# Patient Record
Sex: Male | Born: 1965 | Race: White | Hispanic: No | Marital: Married | Smoking: Never smoker
Health system: Southern US, Community
[De-identification: ages and names within clinical notes are randomized; demographics above are authoritative.]

## PROBLEM LIST (undated history)

## (undated) DIAGNOSIS — R011 Cardiac murmur, unspecified: Secondary | ICD-10-CM

## (undated) DIAGNOSIS — C801 Malignant (primary) neoplasm, unspecified: Secondary | ICD-10-CM

## (undated) DIAGNOSIS — J45909 Unspecified asthma, uncomplicated: Secondary | ICD-10-CM

## (undated) DIAGNOSIS — F419 Anxiety disorder, unspecified: Secondary | ICD-10-CM

## (undated) HISTORY — PX: VASECTOMY: SHX75

## (undated) HISTORY — PX: WISDOM TOOTH EXTRACTION: SHX21

---

## 2017-03-05 DIAGNOSIS — M545 Low back pain: Secondary | ICD-10-CM | POA: Diagnosis not present

## 2017-07-13 DIAGNOSIS — M5032 Other cervical disc degeneration, mid-cervical region, unspecified level: Secondary | ICD-10-CM | POA: Diagnosis not present

## 2017-07-13 DIAGNOSIS — M9901 Segmental and somatic dysfunction of cervical region: Secondary | ICD-10-CM | POA: Diagnosis not present

## 2017-07-13 DIAGNOSIS — S161XXA Strain of muscle, fascia and tendon at neck level, initial encounter: Secondary | ICD-10-CM | POA: Diagnosis not present

## 2017-07-16 DIAGNOSIS — Z125 Encounter for screening for malignant neoplasm of prostate: Secondary | ICD-10-CM | POA: Diagnosis not present

## 2017-07-16 DIAGNOSIS — E349 Endocrine disorder, unspecified: Secondary | ICD-10-CM | POA: Diagnosis not present

## 2017-07-16 DIAGNOSIS — E559 Vitamin D deficiency, unspecified: Secondary | ICD-10-CM | POA: Diagnosis not present

## 2017-07-16 DIAGNOSIS — Z0000 Encounter for general adult medical examination without abnormal findings: Secondary | ICD-10-CM | POA: Diagnosis not present

## 2017-07-18 DIAGNOSIS — M5032 Other cervical disc degeneration, mid-cervical region, unspecified level: Secondary | ICD-10-CM | POA: Diagnosis not present

## 2017-07-18 DIAGNOSIS — S161XXA Strain of muscle, fascia and tendon at neck level, initial encounter: Secondary | ICD-10-CM | POA: Diagnosis not present

## 2017-07-18 DIAGNOSIS — M9901 Segmental and somatic dysfunction of cervical region: Secondary | ICD-10-CM | POA: Diagnosis not present

## 2017-07-23 DIAGNOSIS — S161XXA Strain of muscle, fascia and tendon at neck level, initial encounter: Secondary | ICD-10-CM | POA: Diagnosis not present

## 2017-07-23 DIAGNOSIS — M9901 Segmental and somatic dysfunction of cervical region: Secondary | ICD-10-CM | POA: Diagnosis not present

## 2017-07-23 DIAGNOSIS — M5032 Other cervical disc degeneration, mid-cervical region, unspecified level: Secondary | ICD-10-CM | POA: Diagnosis not present

## 2017-07-25 DIAGNOSIS — M5032 Other cervical disc degeneration, mid-cervical region, unspecified level: Secondary | ICD-10-CM | POA: Diagnosis not present

## 2017-07-25 DIAGNOSIS — S161XXA Strain of muscle, fascia and tendon at neck level, initial encounter: Secondary | ICD-10-CM | POA: Diagnosis not present

## 2017-07-25 DIAGNOSIS — M9901 Segmental and somatic dysfunction of cervical region: Secondary | ICD-10-CM | POA: Diagnosis not present

## 2017-07-30 DIAGNOSIS — S161XXA Strain of muscle, fascia and tendon at neck level, initial encounter: Secondary | ICD-10-CM | POA: Diagnosis not present

## 2017-07-30 DIAGNOSIS — M5032 Other cervical disc degeneration, mid-cervical region, unspecified level: Secondary | ICD-10-CM | POA: Diagnosis not present

## 2017-07-30 DIAGNOSIS — M9901 Segmental and somatic dysfunction of cervical region: Secondary | ICD-10-CM | POA: Diagnosis not present

## 2017-08-01 DIAGNOSIS — M9901 Segmental and somatic dysfunction of cervical region: Secondary | ICD-10-CM | POA: Diagnosis not present

## 2017-08-01 DIAGNOSIS — S161XXA Strain of muscle, fascia and tendon at neck level, initial encounter: Secondary | ICD-10-CM | POA: Diagnosis not present

## 2017-08-01 DIAGNOSIS — M5032 Other cervical disc degeneration, mid-cervical region, unspecified level: Secondary | ICD-10-CM | POA: Diagnosis not present

## 2017-08-02 DIAGNOSIS — M5032 Other cervical disc degeneration, mid-cervical region, unspecified level: Secondary | ICD-10-CM | POA: Diagnosis not present

## 2017-08-02 DIAGNOSIS — S161XXA Strain of muscle, fascia and tendon at neck level, initial encounter: Secondary | ICD-10-CM | POA: Diagnosis not present

## 2017-08-02 DIAGNOSIS — M9901 Segmental and somatic dysfunction of cervical region: Secondary | ICD-10-CM | POA: Diagnosis not present

## 2017-08-06 DIAGNOSIS — M5032 Other cervical disc degeneration, mid-cervical region, unspecified level: Secondary | ICD-10-CM | POA: Diagnosis not present

## 2017-08-06 DIAGNOSIS — M9901 Segmental and somatic dysfunction of cervical region: Secondary | ICD-10-CM | POA: Diagnosis not present

## 2017-08-06 DIAGNOSIS — S161XXA Strain of muscle, fascia and tendon at neck level, initial encounter: Secondary | ICD-10-CM | POA: Diagnosis not present

## 2017-08-09 DIAGNOSIS — M5032 Other cervical disc degeneration, mid-cervical region, unspecified level: Secondary | ICD-10-CM | POA: Diagnosis not present

## 2017-08-09 DIAGNOSIS — S161XXA Strain of muscle, fascia and tendon at neck level, initial encounter: Secondary | ICD-10-CM | POA: Diagnosis not present

## 2017-08-09 DIAGNOSIS — M9901 Segmental and somatic dysfunction of cervical region: Secondary | ICD-10-CM | POA: Diagnosis not present

## 2017-08-15 DIAGNOSIS — M9901 Segmental and somatic dysfunction of cervical region: Secondary | ICD-10-CM | POA: Diagnosis not present

## 2017-08-15 DIAGNOSIS — S161XXA Strain of muscle, fascia and tendon at neck level, initial encounter: Secondary | ICD-10-CM | POA: Diagnosis not present

## 2017-08-15 DIAGNOSIS — M5032 Other cervical disc degeneration, mid-cervical region, unspecified level: Secondary | ICD-10-CM | POA: Diagnosis not present

## 2017-08-22 DIAGNOSIS — M5032 Other cervical disc degeneration, mid-cervical region, unspecified level: Secondary | ICD-10-CM | POA: Diagnosis not present

## 2017-08-22 DIAGNOSIS — M9901 Segmental and somatic dysfunction of cervical region: Secondary | ICD-10-CM | POA: Diagnosis not present

## 2017-08-22 DIAGNOSIS — S161XXA Strain of muscle, fascia and tendon at neck level, initial encounter: Secondary | ICD-10-CM | POA: Diagnosis not present

## 2017-08-23 DIAGNOSIS — S161XXA Strain of muscle, fascia and tendon at neck level, initial encounter: Secondary | ICD-10-CM | POA: Diagnosis not present

## 2017-08-23 DIAGNOSIS — M9901 Segmental and somatic dysfunction of cervical region: Secondary | ICD-10-CM | POA: Diagnosis not present

## 2017-08-23 DIAGNOSIS — M5032 Other cervical disc degeneration, mid-cervical region, unspecified level: Secondary | ICD-10-CM | POA: Diagnosis not present

## 2017-08-31 DIAGNOSIS — K635 Polyp of colon: Secondary | ICD-10-CM | POA: Diagnosis not present

## 2017-08-31 DIAGNOSIS — D120 Benign neoplasm of cecum: Secondary | ICD-10-CM | POA: Diagnosis not present

## 2017-08-31 DIAGNOSIS — C187 Malignant neoplasm of sigmoid colon: Secondary | ICD-10-CM | POA: Diagnosis not present

## 2017-08-31 DIAGNOSIS — Z1211 Encounter for screening for malignant neoplasm of colon: Secondary | ICD-10-CM | POA: Diagnosis not present

## 2017-08-31 DIAGNOSIS — D125 Benign neoplasm of sigmoid colon: Secondary | ICD-10-CM | POA: Diagnosis not present

## 2017-09-05 DIAGNOSIS — C187 Malignant neoplasm of sigmoid colon: Secondary | ICD-10-CM | POA: Diagnosis not present

## 2017-09-07 ENCOUNTER — Ambulatory Visit: Payer: Self-pay | Admitting: General Surgery

## 2017-09-07 DIAGNOSIS — C187 Malignant neoplasm of sigmoid colon: Secondary | ICD-10-CM | POA: Diagnosis not present

## 2017-09-07 NOTE — H&P (View-Only) (Signed)
History of Present Illness Noah Ruff MD; 6/0/1093 10:26 AM) The patient is a 52 year old male who presents with colorectal cancer. 52 year old male who presents to the office for evaluation of a newly diagnosed colon cancer. He underwent a colonoscopy at the end of December 2018. A polyp was found in the sigmoid colon approximately 30 cm from the anal verge. This was resected piecemeal. It was also tattooed. 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 The patient was asymptomatic and undergoing a screening colonoscopy. He reports regular bowel habits. He denies any medical or surgical history.   Past Surgical History Noah Clark, [place]; 09/07/2017 10:04 AM) Colon Polyp Removal - Colonoscopy Oral Surgery Vasectomy  Diagnostic Studies History Noah Clark, [place]; 09/07/2017 10:04 AM) Colonoscopy within last year  Allergies Noah Clark, [place]; 09/07/2017 10:05 AM) No Known Drug Allergies [09/07/2017]:  Medication History Noah Clark, CMA; 09/07/2017 10:05 AM) No Current Medications Medications Reconciled  Social History Noah Clark, CMA; 09/07/2017 10:04 AM) Alcohol use Moderate alcohol use. Caffeine use Carbonated beverages, Coffee, Tea. Illicit drug use Remotely quit drug use. Tobacco use Never smoker.  Family History Noah Clark, [place]; 09/07/2017 10:04 AM) Alcohol Abuse Brother. Arthritis Brother, Father. Cerebrovascular Accident Father. Colon Polyps Father. Depression Mother. Diabetes Mellitus Father. Hypertension Brother, Father, Mother. Melanoma Father.  Other Problems Noah Clark, CMA; 09/07/2017 10:04 AM) Anxiety Disorder Back Pain Colon Cancer     Review of Systems (Noah Clark CMA; 09/07/2017 10:04 AM) General Not Present- Appetite Loss, Chills, Fatigue, Fever, Night Sweats, Weight Gain and Weight Loss. Skin Present- Dryness. Not Present- Change in Wart/Mole, Hives, Jaundice, New Lesions, Non-Healing Wounds, Rash and  Ulcer. HEENT Present- Hearing Loss, Ringing in the Ears and Seasonal Allergies. Not Present- Earache, Hoarseness, Nose Bleed, Oral Ulcers, Sinus Pain, Sore Throat, Visual Disturbances, Wears glasses/contact lenses and Yellow Eyes. Respiratory Not Present- Bloody sputum, Chronic Cough, Difficulty Breathing, Snoring and Wheezing. Breast Not Present- Breast Mass, Breast Pain, Nipple Discharge and Skin Changes. Cardiovascular Not Present- Chest Pain, Difficulty Breathing Lying Down, Leg Cramps, Palpitations, Rapid Heart Rate, Shortness of Breath and Swelling of Extremities. Gastrointestinal Present- Bloating. Not Present- Abdominal Pain, Bloody Stool, Change in Bowel Habits, Chronic diarrhea, Constipation, Difficulty Swallowing, Excessive gas, Gets full quickly at meals, Hemorrhoids, Indigestion, Nausea, Rectal Pain and Vomiting. Male Genitourinary Not Present- Blood in Urine, Change in Urinary Stream, Frequency, Impotence, Nocturia, Painful Urination, Urgency and Urine Leakage. Musculoskeletal Present- Back Pain. Not Present- Joint Pain, Joint Stiffness, Muscle Pain, Muscle Weakness and Swelling of Extremities. Neurological Not Present- Decreased Memory, Fainting, Headaches, Numbness, Seizures, Tingling, Tremor, Trouble walking and Weakness. Psychiatric Present- Anxiety. Not Present- Bipolar, Change in Sleep Pattern, Depression, Fearful and Frequent crying. Endocrine Not Present- Cold Intolerance, Excessive Hunger, Hair Changes, Heat Intolerance, Hot flashes and New Diabetes. Hematology Not Present- Blood Thinners, Easy Bruising, Excessive bleeding, Gland problems, HIV and Persistent Infections.  Vitals (Noah Clark CMA; 09/07/2017 10:05 AM) 09/07/2017 10:04 AM Weight: 240.31 lb Height: 72in Body Surface Area: 2.3 m Body Mass Index: 32.59 kg/m  Temp.: 98.20F  Pulse: 65 (Regular)  P.OX: 97% (Room air) BP: 136/92 (Sitting, Left Arm, Standard)      Physical Exam Noah Ruff MD;  10/08/5571 10:48 AM)  General Mental Status-Alert. General Appearance-Not in acute distress. Build & Nutrition-Well nourished. Posture-Normal posture. Gait-Normal.  Head and Neck Head-normocephalic, atraumatic with no lesions or palpable masses. Trachea-midline.  Chest and Lung Exam Chest and lung exam reveals -on auscultation, normal breath sounds, no adventitious sounds and  normal vocal resonance.  Cardiovascular Cardiovascular examination reveals -normal heart sounds, regular rate and rhythm with no murmurs and no digital clubbing, cyanosis, edema, increased warmth or tenderness.  Abdomen Inspection Inspection of the abdomen reveals - No Hernias. Palpation/Percussion Palpation and Percussion of the abdomen reveal - Soft, Non Tender, No Rigidity (guarding), No hepatosplenomegaly and No Palpable abdominal masses.  Neurologic Neurologic evaluation reveals -alert and oriented x 3 with no impairment of recent or remote memory, normal attention span and ability to concentrate, normal sensation and normal coordination.  Musculoskeletal Normal Exam - Bilateral-Upper Extremity Strength Normal and Lower Extremity Strength Normal.    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 This was tattooed. Cancer was noted at the cauterized edges. We discussed the possibility that he could still have tumor within his colon. We discussed the procedure of a minimally invasive colectomy in detail. We will also get CT scans of his chest abdomen and pelvis and CEA level to complete his metastatic workup prior to surgery. The surgery and anatomy were described to the patient as well as the risks of surgery and the possible complications. These include: Bleeding, deep  abdominal infections and possible wound complications such as hernia and infection, damage to adjacent structures, leak of surgical connections, which can lead to other surgeries and possibly an ostomy, possible need for other procedures, such as abscess drains in radiology, possible prolonged hospital stay, possible diarrhea from removal of part of the colon, possible constipation from narcotics, possible bowel, bladder or sexual dysfunction if having rectal surgery, prolonged fatigue/weakness or appetite loss, possible early recurrence of of disease, possible complications of their medical problems such as heart disease or arrhythmias or lung problems, death (less than 1%). I believe the patient understands and wishes to proceed with the surgery.

## 2017-09-07 NOTE — H&P (Signed)
History of Present Illness Leighton Ruff MD; 02/09/1244 10:26 AM) The patient is a 52 year old male who presents with colorectal cancer. 52 year old male who presents to the office for evaluation of a newly diagnosed colon cancer. He underwent a colonoscopy at the end of December 2018. A polyp was found in the sigmoid colon approximately 30 cm from the anal verge. This was resected piecemeal. It was also tattooed. 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 The patient was asymptomatic and undergoing a screening colonoscopy. He reports regular bowel habits. He denies any medical or surgical history.   Past Surgical History Benjiman Core, [place]; 09/07/2017 10:04 AM) Colon Polyp Removal - Colonoscopy Oral Surgery Vasectomy  Diagnostic Studies History Benjiman Core, [place]; 09/07/2017 10:04 AM) Colonoscopy within last year  Allergies Benjiman Core, [place]; 09/07/2017 10:05 AM) No Known Drug Allergies [09/07/2017]:  Medication History Benjiman Core, CMA; 09/07/2017 10:05 AM) No Current Medications Medications Reconciled  Social History Benjiman Core, CMA; 09/07/2017 10:04 AM) Alcohol use Moderate alcohol use. Caffeine use Carbonated beverages, Coffee, Tea. Illicit drug use Remotely quit drug use. Tobacco use Never smoker.  Family History Benjiman Core, [place]; 09/07/2017 10:04 AM) Alcohol Abuse Brother. Arthritis Brother, Father. Cerebrovascular Accident Father. Colon Polyps Father. Depression Mother. Diabetes Mellitus Father. Hypertension Brother, Father, Mother. Melanoma Father.  Other Problems Benjiman Core, CMA; 09/07/2017 10:04 AM) Anxiety Disorder Back Pain Colon Cancer     Review of Systems ([name] CMA; 09/07/2017 10:04 AM) General Not Present- Appetite Loss, Chills, Fatigue, Fever, Night Sweats, Weight Gain and Weight Loss. Skin Present- Dryness. Not Present- Change in Wart/Mole, Hives, Jaundice, New Lesions, Non-Healing Wounds, Rash and  Ulcer. HEENT Present- Hearing Loss, Ringing in the Ears and Seasonal Allergies. Not Present- Earache, Hoarseness, Nose Bleed, Oral Ulcers, Sinus Pain, Sore Throat, Visual Disturbances, Wears glasses/contact lenses and Yellow Eyes. Respiratory Not Present- Bloody sputum, Chronic Cough, Difficulty Breathing, Snoring and Wheezing. Breast Not Present- Breast Mass, Breast Pain, Nipple Discharge and Skin Changes. Cardiovascular Not Present- Chest Pain, Difficulty Breathing Lying Down, Leg Cramps, Palpitations, Rapid Heart Rate, Shortness of Breath and Swelling of Extremities. Gastrointestinal Present- Bloating. Not Present- Abdominal Pain, Bloody Stool, Change in Bowel Habits, Chronic diarrhea, Constipation, Difficulty Swallowing, Excessive gas, Gets full quickly at meals, Hemorrhoids, Indigestion, Nausea, Rectal Pain and Vomiting. Male Genitourinary Not Present- Blood in Urine, Change in Urinary Stream, Frequency, Impotence, Nocturia, Painful Urination, Urgency and Urine Leakage. Musculoskeletal Present- Back Pain. Not Present- Joint Pain, Joint Stiffness, Muscle Pain, Muscle Weakness and Swelling of Extremities. Neurological Not Present- Decreased Memory, Fainting, Headaches, Numbness, Seizures, Tingling, Tremor, Trouble walking and Weakness. Psychiatric Present- Anxiety. Not Present- Bipolar, Change in Sleep Pattern, Depression, Fearful and Frequent crying. Endocrine Not Present- Cold Intolerance, Excessive Hunger, Hair Changes, Heat Intolerance, Hot flashes and New Diabetes. Hematology Not Present- Blood Thinners, Easy Bruising, Excessive bleeding, Gland problems, HIV and Persistent Infections.  Vitals ([name] CMA; 09/07/2017 10:05 AM) 09/07/2017 10:04 AM Weight: 240.31 lb Height: 72in Body Surface Area: 2.3 m Body Mass Index: 32.59 kg/m  Temp.: 98.43F  Pulse: 65 (Regular)  P.OX: 97% (Room air) BP: 136/92 (Sitting, Left Arm, Standard)      Physical Exam Leighton Ruff MD;  8/0/9983 10:48 AM)  General Mental Status-Alert. General Appearance-Not in acute distress. Build & Nutrition-Well nourished. Posture-Normal posture. Gait-Normal.  Head and Neck Head-normocephalic, atraumatic with no lesions or palpable masses. Trachea-midline.  Chest and Lung Exam Chest and lung exam reveals -on auscultation, normal breath sounds, no adventitious sounds and  normal vocal resonance.  Cardiovascular Cardiovascular examination reveals -normal heart sounds, regular rate and rhythm with no murmurs and no digital clubbing, cyanosis, edema, increased warmth or tenderness.  Abdomen Inspection Inspection of the abdomen reveals - No Hernias. Palpation/Percussion Palpation and Percussion of the abdomen reveal - Soft, Non Tender, No Rigidity (guarding), No hepatosplenomegaly and No Palpable abdominal masses.  Neurologic Neurologic evaluation reveals -alert and oriented x 3 with no impairment of recent or remote memory, normal attention span and ability to concentrate, normal sensation and normal coordination.  Musculoskeletal Normal Exam - Bilateral-Upper Extremity Strength Normal and Lower Extremity Strength Normal.    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 This was tattooed. Cancer was noted at the cauterized edges. We discussed the possibility that he could still have tumor within his colon. We discussed the procedure of a minimally invasive colectomy in detail. We will also get CT scans of his chest abdomen and pelvis and CEA level to complete his metastatic workup prior to surgery. The surgery and anatomy were described to the patient as well as the risks of surgery and the possible complications. These include: Bleeding, deep  abdominal infections and possible wound complications such as hernia and infection, damage to adjacent structures, leak of surgical connections, which can lead to other surgeries and possibly an ostomy, possible need for other procedures, such as abscess drains in radiology, possible prolonged hospital stay, possible diarrhea from removal of part of the colon, possible constipation from narcotics, possible bowel, bladder or sexual dysfunction if having rectal surgery, prolonged fatigue/weakness or appetite loss, possible early recurrence of of disease, possible complications of their medical problems such as heart disease or arrhythmias or lung problems, death (less than 1%). I believe the patient understands and wishes to proceed with the surgery.

## 2017-09-07 NOTE — Patient Instructions (Addendum)
Noah Clark  09/07/2017   Your procedure is scheduled on: 09-13-17  Report to [facility] Main  Entrance              Report to admitting at    0800 AM   Call this number if you have problems the morning of surgery [phone]   Remember:  Do not eat food or drink liquids :After Midnight.      Take these medicines the morning of surgery with A SIP OF WATER: NONE                                You may not have any metal on your body including hair pins and              piercings  Do not wear jewelry, lotions, powders or perfumes, deodorant              Men may shave face and neck.   Do not bring valuables to the hospital. [place].  Contacts, dentures or bridgework may not be worn into surgery.  Leave suitcase in the car. After surgery it may be brought to your room.               Please read over the following fact sheets you were given: _____________________________________________________________________           [facility] - Preparing for Surgery Before surgery, you can play an important role.  Because skin is not sterile, your skin needs to be as free of germs as possible.  You can reduce the number of germs on your skin by washing with CHG (chlorahexidine gluconate) soap before surgery.  CHG is an antiseptic cleaner which kills germs and bonds with the skin to continue killing germs even after washing. Please DO NOT use if you have an allergy to CHG or antibacterial soaps.  If your skin becomes reddened/irritated stop using the CHG and inform your nurse when you arrive at Short Stay. Do not shave (including legs and underarms) for at least 48 hours prior to the first CHG shower.  You may shave your face/neck. Please follow these instructions carefully:  1.  Shower with CHG Soap the night before surgery and the  morning of Surgery.  2.  If you choose to wash your hair, wash your hair first as usual  with your  normal  shampoo.  3.  After you shampoo, rinse your hair and body thoroughly to remove the  shampoo.                           4.  Use CHG as you would any other liquid soap.  You can apply chg directly  to the skin and wash                       Gently with a scrungie or clean washcloth.  5.  Apply the CHG Soap to your body ONLY FROM THE NECK DOWN.   Do not use on face/ open  Wound or open sores. Avoid contact with eyes, ears mouth and genitals (private parts).                       Wash face,  Genitals (private parts) with your normal soap.             6.  Wash thoroughly, paying special attention to the area where your surgery  will be performed.  7.  Thoroughly rinse your body with warm water from the neck down.  8.  DO NOT shower/wash with your normal soap after using and rinsing off  the CHG Soap.                9.  Pat yourself dry with a clean towel.            10.  Wear clean pajamas.            11.  Place clean sheets on your bed the night of your first shower and do not  sleep with pets. Day of Surgery : Do not apply any lotions/deodorants the morning of surgery.  Please wear clean clothes to the hospital/surgery center.  FAILURE TO FOLLOW THESE INSTRUCTIONS MAY RESULT IN THE CANCELLATION OF YOUR SURGERY PATIENT SIGNATURE_________________________________  NURSE SIGNATURE__________________________________  ________________________________________________________________________  WHAT IS A BLOOD TRANSFUSION? Blood Transfusion Information  A transfusion is the replacement of blood or some of its parts. Blood is made up of multiple cells which provide different functions.  Red blood cells carry oxygen and are used for blood loss replacement.  White blood cells fight against infection.  Platelets control bleeding.  Plasma helps clot blood.  Other blood products are available for specialized needs, such as hemophilia or other clotting  disorders. BEFORE THE TRANSFUSION  Who gives blood for transfusions?   Healthy volunteers who are fully evaluated to make sure their blood is safe. This is blood bank blood. Transfusion therapy is the safest it has ever been in the practice of medicine. Before blood is taken from a donor, a complete history is taken to make sure that person has no history of diseases nor engages in risky social behavior (examples are intravenous drug use or sexual activity with multiple partners). The donor's travel history is screened to minimize risk of transmitting infections, such as malaria. The donated blood is tested for signs of infectious diseases, such as HIV and hepatitis. The blood is then tested to be sure it is compatible with you in order to minimize the chance of a transfusion reaction. If you or a relative donates blood, this is often done in anticipation of surgery and is not appropriate for emergency situations. It takes many days to process the donated blood. RISKS AND COMPLICATIONS Although transfusion therapy is very safe and saves many lives, the main dangers of transfusion include:   Getting an infectious disease.  Developing a transfusion reaction. This is an allergic reaction to something in the blood you were given. Every precaution is taken to prevent this. The decision to have a blood transfusion has been considered carefully by your caregiver before blood is given. Blood is not given unless the benefits outweigh the risks. AFTER THE TRANSFUSION  Right after receiving a blood transfusion, you will usually feel much better and more energetic. This is especially true if your red blood cells have gotten low (anemic). The transfusion raises the level of the red blood cells which carry oxygen, and this usually causes an energy increase.  The  nurse administering the transfusion will monitor you carefully for complications. HOME CARE INSTRUCTIONS  No special instructions are needed after a  transfusion. You may find your energy is better. Speak with your caregiver about any limitations on activity for underlying diseases you may have. SEEK MEDICAL CARE IF:   Your condition is not improving after your transfusion.  You develop redness or irritation at the intravenous (IV) site. SEEK IMMEDIATE MEDICAL CARE IF:  Any of the following symptoms occur over the next 12 hours:  Shaking chills.  You have a temperature by mouth above 102 F (38.9 C), not controlled by medicine.  Chest, back, or muscle pain.  People around you feel you are not acting correctly or are confused.  Shortness of breath or difficulty breathing.  Dizziness and fainting.  You get a rash or develop hives.  You have a decrease in urine output.  Your urine turns a dark color or changes to pink, red, or brown. Any of the following symptoms occur over the next 10 days:  You have a temperature by mouth above 102 F (38.9 C), not controlled by medicine.  Shortness of breath.  Weakness after normal activity.  The white part of the eye turns yellow (jaundice).  You have a decrease in the amount of urine or are urinating less often.  Your urine turns a dark color or changes to pink, red, or brown. Document Released: 08/18/2000 Document Revised: 11/13/2011 Document Reviewed: 04/06/2008 ExitCare Patient Information 2014 [place].  _______________________________________________________________________  Incentive Spirometer  An incentive spirometer is a tool that can help keep your lungs clear and active. This tool measures how well you are filling your lungs with each breath. Taking long deep breaths may help reverse or decrease the chance of developing breathing (pulmonary) problems (especially infection) following:  A long period of time when you are unable to move or be active. BEFORE THE PROCEDURE   If the spirometer includes an indicator to show your best effort, your nurse or  respiratory therapist will set it to a desired goal.  If possible, sit up straight or lean slightly forward. Try not to slouch.  Hold the incentive spirometer in an upright position. INSTRUCTIONS FOR USE  1. Sit on the edge of your bed if possible, or sit up as far as you can in bed or on a chair. 2. Hold the incentive spirometer in an upright position. 3. Breathe out normally. 4. Place the mouthpiece in your mouth and seal your lips tightly around it. 5. Breathe in slowly and as deeply as possible, raising the piston or the ball toward the top of the column. 6. Hold your breath for 3-5 seconds or for as long as possible. Allow the piston or ball to fall to the bottom of the column. 7. Remove the mouthpiece from your mouth and breathe out normally. 8. Rest for a few seconds and repeat Steps 1 through 7 at least 10 times every 1-2 hours when you are awake. Take your time and take a few normal breaths between deep breaths. 9. The spirometer may include an indicator to show your best effort. Use the indicator as a goal to work toward during each repetition. 10. After each set of 10 deep breaths, practice coughing to be sure your lungs are clear. If you have an incision (the cut made at the time of surgery), support your incision when coughing by placing a pillow or rolled up towels firmly against it. Once you are able to get  out of bed, walk around indoors and cough well. You may stop using the incentive spirometer when instructed by your caregiver.  RISKS AND COMPLICATIONS  Take your time so you do not get dizzy or light-headed.  If you are in pain, you may need to take or ask for pain medication before doing incentive spirometry. It is harder to take a deep breath if you are having pain. AFTER USE  Rest and breathe slowly and easily.  It can be helpful to keep track of a log of your progress. Your caregiver can provide you with a simple table to help with this. If you are using the  spirometer at home, follow these instructions: [place] IF:   You are having difficultly using the spirometer.  You have trouble using the spirometer as often as instructed.  Your pain medication is not giving enough relief while using the spirometer.  You develop fever of 100.5 F (38.1 C) or higher. SEEK IMMEDIATE MEDICAL CARE IF:   You cough up bloody sputum that had not been present before.  You develop fever of 102 F (38.9 C) or greater.  You develop worsening pain at or near the incision site. MAKE SURE YOU:   Understand these instructions.  Will watch your condition.  Will get help right away if you are not doing well or get worse. Document Released: 01/01/2007 Document Revised: 11/13/2011 Document Reviewed: 03/04/2007 [facility] Patient Information 2014 [place], [place].   ________________________________________________________________________

## 2017-09-10 ENCOUNTER — Ambulatory Visit
Admission: RE | Admit: 2017-09-10 | Discharge: 2017-09-10 | Disposition: A | Discharge status: Home / Self Care | Payer: 59 | Source: Ambulatory Visit | Attending: General Surgery | Admitting: General Surgery

## 2017-09-10 ENCOUNTER — Other Ambulatory Visit: Payer: Self-pay | Admitting: General Surgery

## 2017-09-10 DIAGNOSIS — C187 Malignant neoplasm of sigmoid colon: Secondary | ICD-10-CM

## 2017-09-10 DIAGNOSIS — R918 Other nonspecific abnormal finding of lung field: Secondary | ICD-10-CM | POA: Diagnosis not present

## 2017-09-10 MED ORDER — IOPAMIDOL (ISOVUE-300) INJECTION 61%
125.0000 mL | Freq: Once | INTRAVENOUS | Status: AC | PRN
  Administered 2017-09-10: 125 mL via INTRAVENOUS

## 2017-09-11 ENCOUNTER — Other Ambulatory Visit: Payer: Self-pay

## 2017-09-11 ENCOUNTER — Encounter (HOSPITAL_COMMUNITY)
Admission: RE | Admit: 2017-09-11 | Discharge: 2017-09-11 | Disposition: A | Discharge status: Home / Self Care | Payer: 59 | Source: Ambulatory Visit | Attending: General Surgery | Admitting: General Surgery

## 2017-09-11 ENCOUNTER — Encounter (INDEPENDENT_AMBULATORY_CARE_PROVIDER_SITE_OTHER): Payer: Self-pay

## 2017-09-11 ENCOUNTER — Encounter (HOSPITAL_COMMUNITY): Payer: Self-pay

## 2017-09-11 HISTORY — DX: Anxiety disorder, unspecified: F41.9

## 2017-09-11 HISTORY — DX: Cardiac murmur, unspecified: R01.1

## 2017-09-11 HISTORY — DX: Malignant (primary) neoplasm, unspecified: C80.1

## 2017-09-11 HISTORY — DX: Unspecified asthma, uncomplicated: J45.909

## 2017-09-11 LAB — CBC
HEMATOCRIT: 42 % (ref 39.0–52.0)
HEMOGLOBIN: 14.7 g/dL (ref 13.0–17.0)
MCH: 30.7 pg (ref 26.0–34.0)
MCHC: 35 g/dL (ref 30.0–36.0)
MCV: 87.7 fL (ref 78.0–100.0)
Platelets: 294 10*3/uL (ref 150–400)
RBC: 4.79 MIL/uL (ref 4.22–5.81)
RDW: 12.8 % (ref 11.5–15.5)
WBC: 8 10*3/uL (ref 4.0–10.5)

## 2017-09-11 LAB — ABO/RH: ABO/RH(D): A POS

## 2017-09-12 LAB — CEA: CEA: 1.8 ng/mL (ref 0.0–4.7)

## 2017-09-13 ENCOUNTER — Encounter (HOSPITAL_COMMUNITY): Payer: Self-pay | Admitting: *Deleted

## 2017-09-13 ENCOUNTER — Other Ambulatory Visit: Payer: Self-pay

## 2017-09-13 ENCOUNTER — Inpatient Hospital Stay (HOSPITAL_COMMUNITY): Payer: 59 | Admitting: Registered Nurse

## 2017-09-13 ENCOUNTER — Encounter (HOSPITAL_COMMUNITY)
Admission: RE | Disposition: A | Discharge status: Home / Self Care | Payer: Self-pay | Source: Ambulatory Visit | Attending: General Surgery

## 2017-09-13 ENCOUNTER — Inpatient Hospital Stay (HOSPITAL_COMMUNITY)
Admission: RE | Admit: 2017-09-13 | Discharge: 2017-09-15 | DRG: 331 | Disposition: A | Discharge status: Home / Self Care | Payer: 59 | Source: Ambulatory Visit | Attending: General Surgery | Admitting: General Surgery

## 2017-09-13 DIAGNOSIS — C189 Malignant neoplasm of colon, unspecified: Secondary | ICD-10-CM

## 2017-09-13 DIAGNOSIS — C19 Malignant neoplasm of rectosigmoid junction: Secondary | ICD-10-CM | POA: Diagnosis not present

## 2017-09-13 DIAGNOSIS — Z833 Family history of diabetes mellitus: Secondary | ICD-10-CM | POA: Diagnosis not present

## 2017-09-13 DIAGNOSIS — Z808 Family history of malignant neoplasm of other organs or systems: Secondary | ICD-10-CM

## 2017-09-13 DIAGNOSIS — F419 Anxiety disorder, unspecified: Secondary | ICD-10-CM | POA: Diagnosis present

## 2017-09-13 DIAGNOSIS — K633 Ulcer of intestine: Secondary | ICD-10-CM | POA: Diagnosis not present

## 2017-09-13 DIAGNOSIS — Z8249 Family history of ischemic heart disease and other diseases of the circulatory system: Secondary | ICD-10-CM

## 2017-09-13 DIAGNOSIS — C187 Malignant neoplasm of sigmoid colon: Secondary | ICD-10-CM | POA: Diagnosis not present

## 2017-09-13 HISTORY — PX: LAPAROSCOPIC PARTIAL COLECTOMY: SHX5907

## 2017-09-13 HISTORY — DX: Malignant neoplasm of colon, unspecified: C18.9

## 2017-09-13 LAB — TYPE AND SCREEN
ABO/RH(D): A POS
ANTIBODY SCREEN: NEGATIVE

## 2017-09-13 SURGERY — LAPAROSCOPIC PARTIAL COLECTOMY
Anesthesia: General | Site: Abdomen

## 2017-09-13 MED ORDER — LACTATED RINGERS IR SOLN
Status: DC | PRN
  Administered 2017-09-13: 1000 mL

## 2017-09-13 MED ORDER — BUPIVACAINE-EPINEPHRINE 0.25% -1:200000 IJ SOLN
INTRAMUSCULAR | Status: DC | PRN
  Administered 2017-09-13: 50 mL

## 2017-09-13 MED ORDER — FENTANYL CITRATE (PF) 100 MCG/2ML IJ SOLN
INTRAMUSCULAR | Status: AC
  Filled 2017-09-13: qty 2

## 2017-09-13 MED ORDER — ALUM & MAG HYDROXIDE-SIMETH 200-200-20 MG/5ML PO SUSP: 30.0000 mL | Freq: Four times a day (QID) | ORAL | Status: DC | PRN

## 2017-09-13 MED ORDER — CEFOTETAN DISODIUM-DEXTROSE 2-2.08 GM-%(50ML) IV SOLR
2.0000 g | INTRAVENOUS | Status: AC
  Administered 2017-09-13: 2 g via INTRAVENOUS
  Filled 2017-09-13: qty 50

## 2017-09-13 MED ORDER — LIDOCAINE 2% (20 MG/ML) 5 ML SYRINGE
INTRAMUSCULAR | Status: AC
  Filled 2017-09-13: qty 10

## 2017-09-13 MED ORDER — BUPIVACAINE LIPOSOME 1.3 % IJ SUSP
INTRAMUSCULAR | Status: DC | PRN
  Administered 2017-09-13: 20 mL

## 2017-09-13 MED ORDER — ROCURONIUM BROMIDE 10 MG/ML (PF) SYRINGE
PREFILLED_SYRINGE | INTRAVENOUS | Status: DC | PRN
  Administered 2017-09-13: 50 mg via INTRAVENOUS
  Administered 2017-09-13 (×2): 10 mg via INTRAVENOUS

## 2017-09-13 MED ORDER — KCL IN DEXTROSE-NACL 20-5-0.45 MEQ/L-%-% IV SOLN
INTRAVENOUS | Status: DC
  Administered 2017-09-13 (×2): via INTRAVENOUS
  Filled 2017-09-13 (×2): qty 1000

## 2017-09-13 MED ORDER — PROPOFOL 10 MG/ML IV BOLUS
INTRAVENOUS | Status: AC
  Filled 2017-09-13: qty 20

## 2017-09-13 MED ORDER — GLYCOPYRROLATE 0.2 MG/ML IV SOSY
PREFILLED_SYRINGE | INTRAVENOUS | Status: AC
  Filled 2017-09-13: qty 5

## 2017-09-13 MED ORDER — LIDOCAINE 2% (20 MG/ML) 5 ML SYRINGE
INTRAMUSCULAR | Status: DC | PRN
  Administered 2017-09-13: 100 mg via INTRAVENOUS

## 2017-09-13 MED ORDER — DEXAMETHASONE SODIUM PHOSPHATE 10 MG/ML IJ SOLN
INTRAMUSCULAR | Status: AC
  Filled 2017-09-13: qty 1

## 2017-09-13 MED ORDER — ENOXAPARIN SODIUM 40 MG/0.4ML ~~LOC~~ SOLN
40.0000 mg | SUBCUTANEOUS | Status: DC
  Administered 2017-09-14 – 2017-09-15 (×2): 40 mg via SUBCUTANEOUS
  Filled 2017-09-13 (×2): qty 0.4

## 2017-09-13 MED ORDER — EPHEDRINE SULFATE-NACL 50-0.9 MG/10ML-% IV SOSY
PREFILLED_SYRINGE | INTRAVENOUS | Status: DC | PRN
  Administered 2017-09-13: 5 mg via INTRAVENOUS

## 2017-09-13 MED ORDER — ONDANSETRON HCL 4 MG/2ML IJ SOLN: 4.0000 mg | Freq: Once | INTRAMUSCULAR | Status: DC | PRN

## 2017-09-13 MED ORDER — LACTATED RINGERS IV SOLN
INTRAVENOUS | Status: DC
  Administered 2017-09-13: 09:00:00 via INTRAVENOUS

## 2017-09-13 MED ORDER — 0.9 % SODIUM CHLORIDE (POUR BTL) OPTIME
TOPICAL | Status: DC | PRN
  Administered 2017-09-13: 3000 mL

## 2017-09-13 MED ORDER — SUGAMMADEX SODIUM 200 MG/2ML IV SOLN
INTRAVENOUS | Status: AC
Start: 2017-09-13 — End: ?
  Filled 2017-09-13: qty 2

## 2017-09-13 MED ORDER — LIDOCAINE 2% (20 MG/ML) 5 ML SYRINGE
INTRAMUSCULAR | Status: AC
  Filled 2017-09-13: qty 5

## 2017-09-13 MED ORDER — ONDANSETRON HCL 4 MG PO TABS: 4.0000 mg | ORAL_TABLET | Freq: Four times a day (QID) | ORAL | Status: DC | PRN

## 2017-09-13 MED ORDER — ONDANSETRON HCL 4 MG/2ML IJ SOLN: 4.0000 mg | Freq: Four times a day (QID) | INTRAMUSCULAR | Status: DC | PRN

## 2017-09-13 MED ORDER — ALVIMOPAN 12 MG PO CAPS
12.0000 mg | ORAL_CAPSULE | Freq: Two times a day (BID) | ORAL | Status: DC
  Administered 2017-09-14: 12 mg via ORAL
  Filled 2017-09-13: qty 1

## 2017-09-13 MED ORDER — FENTANYL CITRATE (PF) 100 MCG/2ML IJ SOLN
INTRAMUSCULAR | Status: DC | PRN
  Administered 2017-09-13 (×3): 50 ug via INTRAVENOUS
  Administered 2017-09-13: 100 ug via INTRAVENOUS
  Administered 2017-09-13: 50 ug via INTRAVENOUS
  Administered 2017-09-13: 100 ug via INTRAVENOUS

## 2017-09-13 MED ORDER — ROCURONIUM BROMIDE 50 MG/5ML IV SOSY
PREFILLED_SYRINGE | INTRAVENOUS | Status: AC
  Filled 2017-09-13: qty 5

## 2017-09-13 MED ORDER — HYDROMORPHONE HCL 1 MG/ML IJ SOLN: 0.5000 mg | INTRAMUSCULAR | Status: DC | PRN

## 2017-09-13 MED ORDER — GABAPENTIN 300 MG PO CAPS
300.0000 mg | ORAL_CAPSULE | Freq: Two times a day (BID) | ORAL | Status: DC
  Administered 2017-09-13 – 2017-09-15 (×4): 300 mg via ORAL
  Filled 2017-09-13 (×4): qty 1

## 2017-09-13 MED ORDER — ALVIMOPAN 12 MG PO CAPS
12.0000 mg | ORAL_CAPSULE | ORAL | Status: AC
  Administered 2017-09-13: 12 mg via ORAL
  Filled 2017-09-13: qty 1

## 2017-09-13 MED ORDER — BUPIVACAINE LIPOSOME 1.3 % IJ SUSP
20.0000 mL | INTRAMUSCULAR | Status: DC
  Filled 2017-09-13: qty 20

## 2017-09-13 MED ORDER — PROPOFOL 10 MG/ML IV BOLUS
INTRAVENOUS | Status: DC | PRN
  Administered 2017-09-13: 140 mg via INTRAVENOUS

## 2017-09-13 MED ORDER — MIDAZOLAM HCL 2 MG/2ML IJ SOLN
INTRAMUSCULAR | Status: AC
  Filled 2017-09-13: qty 2

## 2017-09-13 MED ORDER — ACETAMINOPHEN 500 MG PO TABS
1000.0000 mg | ORAL_TABLET | ORAL | Status: AC
  Administered 2017-09-13: 1000 mg via ORAL
  Filled 2017-09-13: qty 2

## 2017-09-13 MED ORDER — SUGAMMADEX SODIUM 200 MG/2ML IV SOLN
INTRAVENOUS | Status: DC | PRN
  Administered 2017-09-13: 200 mg via INTRAVENOUS

## 2017-09-13 MED ORDER — ONDANSETRON HCL 4 MG/2ML IJ SOLN
INTRAMUSCULAR | Status: DC | PRN
  Administered 2017-09-13: 4 mg via INTRAVENOUS

## 2017-09-13 MED ORDER — EPHEDRINE 5 MG/ML INJ
INTRAVENOUS | Status: AC
  Filled 2017-09-13: qty 10

## 2017-09-13 MED ORDER — HYDROMORPHONE HCL 1 MG/ML IJ SOLN
0.2500 mg | INTRAMUSCULAR | Status: DC | PRN
  Administered 2017-09-13 (×2): 0.5 mg via INTRAVENOUS

## 2017-09-13 MED ORDER — BUPIVACAINE-EPINEPHRINE 0.25% -1:200000 IJ SOLN
INTRAMUSCULAR | Status: AC
  Filled 2017-09-13: qty 1

## 2017-09-13 MED ORDER — CELECOXIB 200 MG PO CAPS
200.0000 mg | ORAL_CAPSULE | ORAL | Status: AC
  Administered 2017-09-13: 200 mg via ORAL
  Filled 2017-09-13: qty 1

## 2017-09-13 MED ORDER — ACETAMINOPHEN 500 MG PO TABS
1000.0000 mg | ORAL_TABLET | Freq: Four times a day (QID) | ORAL | Status: DC
  Administered 2017-09-13 – 2017-09-15 (×7): 1000 mg via ORAL
  Filled 2017-09-13 (×7): qty 2

## 2017-09-13 MED ORDER — GABAPENTIN 300 MG PO CAPS
300.0000 mg | ORAL_CAPSULE | ORAL | Status: AC
  Administered 2017-09-13: 300 mg via ORAL
  Filled 2017-09-13: qty 1

## 2017-09-13 MED ORDER — GLYCOPYRROLATE 0.2 MG/ML IV SOSY
PREFILLED_SYRINGE | INTRAVENOUS | Status: DC | PRN
  Administered 2017-09-13: .1 mg via INTRAVENOUS

## 2017-09-13 MED ORDER — MIDAZOLAM HCL 5 MG/5ML IJ SOLN
INTRAMUSCULAR | Status: DC | PRN
  Administered 2017-09-13: 2 mg via INTRAVENOUS

## 2017-09-13 MED ORDER — ONDANSETRON HCL 4 MG/2ML IJ SOLN
INTRAMUSCULAR | Status: AC
  Filled 2017-09-13: qty 2

## 2017-09-13 MED ORDER — HYDROMORPHONE HCL 1 MG/ML IJ SOLN
INTRAMUSCULAR | Status: AC
  Filled 2017-09-13: qty 1

## 2017-09-13 MED ORDER — DEXAMETHASONE SODIUM PHOSPHATE 10 MG/ML IJ SOLN
INTRAMUSCULAR | Status: DC | PRN
  Administered 2017-09-13: 10 mg via INTRAVENOUS

## 2017-09-13 MED ORDER — LIDOCAINE 2% (20 MG/ML) 5 ML SYRINGE
INTRAMUSCULAR | Status: DC | PRN
  Administered 2017-09-13: 1 mg/kg/h via INTRAVENOUS

## 2017-09-13 MED ORDER — MEPERIDINE HCL 50 MG/ML IJ SOLN: 6.2500 mg | INTRAMUSCULAR | Status: DC | PRN

## 2017-09-13 MED ORDER — DEXTROSE 5 % IV SOLN
2.0000 g | Freq: Two times a day (BID) | INTRAVENOUS | Status: AC
  Administered 2017-09-13: 2 g via INTRAVENOUS
  Filled 2017-09-13: qty 2

## 2017-09-13 SURGICAL SUPPLY — 72 items
ADH SKN CLS APL DERMABOND .7 (GAUZE/BANDAGES/DRESSINGS) ×1
APPLIER CLIP 5 13 M/L LIGAMAX5 (MISCELLANEOUS)
APR CLP MED LRG 5 ANG JAW (MISCELLANEOUS)
BLADE EXTENDED COATED 6.5IN (ELECTRODE) IMPLANT
CABLE HIGH FREQUENCY MONO STRZ (ELECTRODE) ×3 IMPLANT
CELLS DAT CNTRL 66122 CELL SVR (MISCELLANEOUS) ×1 IMPLANT
CHLORAPREP W/TINT 26ML (MISCELLANEOUS) ×3 IMPLANT
CLIP APPLIE 5 13 M/L LIGAMAX5 (MISCELLANEOUS) IMPLANT
DECANTER SPIKE VIAL GLASS SM (MISCELLANEOUS) ×3 IMPLANT
DERMABOND ADVANCED (GAUZE/BANDAGES/DRESSINGS) ×2
DERMABOND ADVANCED .7 DNX12 (GAUZE/BANDAGES/DRESSINGS) ×1 IMPLANT
DRAIN CHANNEL 19F RND (DRAIN) IMPLANT
DRAPE LAPAROSCOPIC ABDOMINAL (DRAPES) ×3 IMPLANT
DRAPE SURG IRRIG POUCH 19X23 (DRAPES) ×3 IMPLANT
DRSG OPSITE POSTOP 4X10 (GAUZE/BANDAGES/DRESSINGS) IMPLANT
DRSG OPSITE POSTOP 4X6 (GAUZE/BANDAGES/DRESSINGS) ×2 IMPLANT
DRSG OPSITE POSTOP 4X8 (GAUZE/BANDAGES/DRESSINGS) IMPLANT
ELECT PENCIL ROCKER SW 15FT (MISCELLANEOUS) ×6 IMPLANT
ELECT REM PT RETURN 15FT ADLT (MISCELLANEOUS) ×3 IMPLANT
EVACUATOR SILICONE 100CC (DRAIN) IMPLANT
GAUZE SPONGE 4X4 12PLY STRL (GAUZE/BANDAGES/DRESSINGS) IMPLANT
GLOVE BIO SURGEON STRL SZ 6.5 (GLOVE) ×4 IMPLANT
GLOVE BIO SURGEONS STRL SZ 6.5 (GLOVE) ×2
GLOVE BIOGEL PI IND STRL 7.0 (GLOVE) ×2 IMPLANT
GLOVE BIOGEL PI INDICATOR 7.0 (GLOVE) ×4
GOWN STRL REUS W/TWL 2XL LVL3 (GOWN DISPOSABLE) ×6 IMPLANT
GOWN STRL REUS W/TWL XL LVL3 (GOWN DISPOSABLE) ×12 IMPLANT
GRASPER ENDOPATH ANVIL 10MM (MISCELLANEOUS) IMPLANT
HANDLE STAPLE EGIA 4 XL (STAPLE) ×2 IMPLANT
HOLDER FOLEY CATH W/STRAP (MISCELLANEOUS) ×3 IMPLANT
IRRIG SUCT STRYKERFLOW 2 WTIP (MISCELLANEOUS) ×3
IRRIGATION SUCT STRKRFLW 2 WTP (MISCELLANEOUS) ×1 IMPLANT
LUBRICANT JELLY K Y 4OZ (MISCELLANEOUS) ×3 IMPLANT
PACK COLON (CUSTOM PROCEDURE TRAY) ×3 IMPLANT
PAD POSITIONING PINK XL (MISCELLANEOUS) ×3 IMPLANT
PORT LAP GEL ALEXIS MED 5-9CM (MISCELLANEOUS) ×3 IMPLANT
POSITIONER SURGICAL ARM (MISCELLANEOUS) ×3 IMPLANT
RELOAD EGIA 60 MED/THCK PURPLE (STAPLE) ×3 IMPLANT
RELOAD STAPLE 60 MED/THCK ART (STAPLE) IMPLANT
RETRACTOR WND ALEXIS 18 MED (MISCELLANEOUS) IMPLANT
RTRCTR WOUND ALEXIS 18CM MED (MISCELLANEOUS) ×3
SCISSORS LAP 5X35 DISP (ENDOMECHANICALS) ×3 IMPLANT
SCISSORS METZENBAUM CVD 33 (INSTRUMENTS) ×2 IMPLANT
SEALER TISSUE G2 STRG ARTC 35C (ENDOMECHANICALS) ×2 IMPLANT
SEALER TISSUE X1 CVD JAW (INSTRUMENTS) IMPLANT
SLEEVE XCEL OPT CAN 5 100 (ENDOMECHANICALS) ×3 IMPLANT
SPONGE DRAIN TRACH 4X4 STRL 2S (GAUZE/BANDAGES/DRESSINGS) IMPLANT
SPONGE LAP 18X18 X RAY DECT (DISPOSABLE) IMPLANT
STAPLER CIRC ILS CVD 33MM 37CM (STAPLE) ×2 IMPLANT
STAPLER VISISTAT 35W (STAPLE) IMPLANT
SUT ETHILON 2 0 PS N (SUTURE) IMPLANT
SUT NOVA NAB GS-21 0 18 T12 DT (SUTURE) ×6 IMPLANT
SUT PDS AB 1 CTX 36 (SUTURE) IMPLANT
SUT PDS AB 1 TP1 96 (SUTURE) IMPLANT
SUT PROLENE 2 0 KS (SUTURE) ×3 IMPLANT
SUT SILK 2 0 (SUTURE) ×3
SUT SILK 2 0 SH CR/8 (SUTURE) ×3 IMPLANT
SUT SILK 2-0 18XBRD TIE 12 (SUTURE) ×1 IMPLANT
SUT SILK 3 0 (SUTURE) ×3
SUT SILK 3 0 SH CR/8 (SUTURE) ×3 IMPLANT
SUT SILK 3-0 18XBRD TIE 12 (SUTURE) ×1 IMPLANT
SUT VIC AB 2-0 SH 18 (SUTURE) ×3 IMPLANT
SUT VIC AB 4-0 PS2 27 (SUTURE) ×3 IMPLANT
SYS LAPSCP GELPORT 120MM (MISCELLANEOUS)
SYSTEM LAPSCP GELPORT 120MM (MISCELLANEOUS) IMPLANT
TOWEL OR NON WOVEN STRL DISP B (DISPOSABLE) ×3 IMPLANT
TRAY FOLEY W/METER SILVER 16FR (SET/KITS/TRAYS/PACK) ×2 IMPLANT
TROCAR BLADELESS OPT 5 100 (ENDOMECHANICALS) ×3 IMPLANT
TROCAR XCEL BLUNT TIP 100MML (ENDOMECHANICALS) IMPLANT
TUBING CONNECTING 10 (TUBING) ×2 IMPLANT
TUBING CONNECTING 10' (TUBING) ×1
TUBING INSUF HEATED (TUBING) ×3 IMPLANT

## 2017-09-13 NOTE — Anesthesia Procedure Notes (Signed)
Procedure Name: Intubation Date/Time: 09/13/2017 10:13 AM Performed by: Victoriano Lain, CRNA Pre-anesthesia Checklist: Patient identified, Emergency Drugs available, Suction available, Patient being monitored and Timeout performed Patient Re-evaluated:Patient Re-evaluated prior to induction Oxygen Delivery Method: Circle system utilized Preoxygenation: Pre-oxygenation with 100% oxygen Induction Type: IV induction Ventilation: Mask ventilation without difficulty and Oral airway inserted - appropriate to patient size Laryngoscope Size: Mac and 4 Grade View: Grade I Tube type: Oral Tube size: 7.5 mm Number of attempts: 1 Airway Equipment and Method: Stylet Placement Confirmation: ETT inserted through vocal cords under direct vision,  positive ETCO2 and breath sounds checked- equal and bilateral Secured at: 22 cm Tube secured with: Tape Dental Injury: Teeth and Oropharynx as per pre-operative assessment

## 2017-09-13 NOTE — Transfer of Care (Signed)
Immediate Anesthesia Transfer of Care Note  Patient: Noah Clark  Procedure(s) Performed: LAPAROSCOPIC SIGMOIDECTOMY COLECTOMY ERAS PATHWAY (N/A Abdomen)  Patient Location: PACU  Anesthesia Type:General  Level of Consciousness: awake, alert , oriented and patient cooperative  Airway & Oxygen Therapy: Patient Spontanous Breathing and Patient connected to face mask oxygen  Post-op Assessment: Report given to RN, Post -op Vital signs reviewed and stable and Patient moving all extremities  Post vital signs: Reviewed and stable  Last Vitals:  Vitals:   09/13/17 0819  BP: (!) 134/92  Pulse: 81  Resp: 18  Temp: 37 C  SpO2: 97%    Last Pain:  Vitals:   09/13/17 0819  TempSrc: Oral         Complications: No apparent anesthesia complications

## 2017-09-13 NOTE — Interval H&P Note (Signed)
History and Physical Interval Note:  09/13/2017 8:07 AM  Noah Clark  has presented today for surgery, with the diagnosis of colon cancer  The various methods of treatment have been discussed with the patient and family. After consideration of risks, benefits and other options for treatment, the patient has consented to  Procedure(s): [place] (N/A) as a surgical intervention .  The patient's history has been reviewed, patient examined, no change in status, stable for surgery.  I have reviewed the patient's chart and labs.  Questions were answered to the patient's satisfaction.     Rosario Adie, MD  [facility]

## 2017-09-13 NOTE — Op Note (Signed)
09/13/2017  12:34 PM  PATIENT:  Noah Clark  52 y.o. male  Patient Care Team: Physicians, [facility] as PCP - General (Family Medicine)  PRE-OPERATIVE DIAGNOSIS:  colon cancer  POST-OPERATIVE DIAGNOSIS:  colon cancer  PROCEDURE:   LAPAROSCOPIC SIGMOIDECTOMY COLECTOMY    Surgeon(s): Leighton Ruff, MD Coralie Keens, MD  ASSISTANT: Dr Ninfa Linden  ANESTHESIA:   local and general  EBL:  Total I/O In: -  Out: 25 [Blood:25]  Delay start of Pharmacological VTE agent (>24hrs) due to surgical blood loss or risk of bleeding:  no  DRAINS: none   SPECIMEN:  Source of Specimen:  Sigmoid colon  DISPOSITION OF SPECIMEN:  PATHOLOGY  COUNTS:  YES  PLAN OF CARE: Admit to inpatient   PATIENT DISPOSITION:  PACU - hemodynamically stable.  INDICATION:    Patient presented with sigmoid polyp resection and colonoscopy showing invasive carcinoma.  The margins were positive for cancer.  I recommended segmental resection:  The anatomy & physiology of the digestive tract was discussed.  The pathophysiology was discussed.  Natural history risks without surgery was discussed.   I worked to give an overview of the disease and the frequent need to have multispecialty involvement.  I feel the risks of no intervention will lead to serious problems that outweigh the operative risks; therefore, I recommended a partial colectomy to remove the pathology.  Laparoscopic & open techniques were discussed.   Risks such as bleeding, infection, abscess, leak, reoperation, possible ostomy, hernia, heart attack, death, and other risks were discussed.  I noted a good likelihood this will help address the problem.   Goals of post-operative recovery were discussed as well.    The patient expressed understanding & wished to proceed with surgery.  OR FINDINGS:   Patient had colon tattoo at the distal sigmoid  No obvious metastatic disease on visceral parietal peritoneum or liver.   DESCRIPTION:    Informed consent was confirmed.  The patient underwent general anaesthesia without difficulty.  The patient was positioned appropriately.  VTE prevention in place.  The patient's abdomen was clipped, prepped, & draped in a sterile fashion.  Surgical timeout confirmed our plan.  The patient was positioned in reverse Trendelenburg.  Abdominal entry was gained using a Varies needle on the LUQ.  Entry was clean.  I induced carbon dioxide insufflation.  Camera inspection revealed no injury.  Extra ports were carefully placed under direct laparoscopic visualization.   I reflected the greater omentum and the upper abdomen the small bowel in the upper abdomen. I scored the base of peritoneum of the right side of the mesentery of the left colon from the ligament of Treitz to the peritoneal reflection of the mid rectum.  The patient had a tattoo noted in the distal sigmoid colon.  I elevated the sigmoid mesentery and enetered into the retro-mesenteric plane. We were able to identify the left ureter and gonadal vessels. We kept those posterior within the retroperitoneum and elevated the left colon mesentery off that. I did isolated IMA pedicle but did not ligate it yet.  I continued distally and got into the avascular plane posterior to the mesorectum. This allowed me to help mobilize the rectum as well by freeing the mesorectum off the sacrum.  I mobilized the peritoneal coverings towards the peritoneal reflection on both the right and left sides of the rectum.  I could see the right and left ureters and stayed away from them.    I skeletonized the inferior mesenteric artery  pedicle.  I went down to its takeoff from the aorta.   I isolated the inferior mesenteric vein off of the ligament of Treitz just cephalad to that as well.  After confirming the left ureter was out of the way, I went ahead and ligated the inferior mesenteric artery pedicle with bipolar EnSeal ~2cm above its takeoff from the aorta.  We ensured  hemostasis. I skeletonized the mesorectum at the junction at the proximal rectum using blunt dissection & bipolar EnSeal.  I mobilized the left colon in a lateral to medial fashion off the line of Toldt up towards the splenic flexure to ensure good mobilization of the left colon to reach into the pelvis.  I then divided the mesentery at the rectosigmoid junction using the Enseal device.  The rectosigmoid was then transected using a purple load Covidien laparoscopic stapler.  I divided the mesentery to the level of the distal descending colon using the Enseal.  I then made a small Pfannenstiel incision and placed the wound protector.  The colon was brought out through this incision site and divided at the previously dissected margin.  This was done over a pursestring device and a 2-0 Prolene pursestring suture was applied.  This was secured with silk sutures.  A 33 mm EEA anvil was placed and the pursestring was tied tightly around this.  This was then placed back into the abdomen.  The EEA stapler was introduced into the rectal stump and an anastomosis was created under laparoscopic visualization.  There was no leak when tested with insufflation.  There was no tension.  The abdomen was irrigated.  Hemostasis was good.  Laparoscopic ports were removed.  The omentum was brought down over the small bowel.  The abdomen was desufflated.  The peritoneum was closed using a running 2-0 Vicryl suture.  The fascia was then reapproximated using interrupted 0 Novafil suture.  The subcutaneous tissue was reapproximated using 2-0 Vicryl sutures and the skin was closed using a 4-0 Vicryl subcuticular suture.  The remaining port sites were also closed with a 4-0 Vicryl suture.  Dermabond and sterile dressings were applied.  The patient was then awakened from anesthesia and sent to the postanesthesia care unit stable condition.  All counts were correct per operating room staff.  an MD assistant was necessary for tissue  manipulation, retraction and positioning due to the complexity of the case and hospital policies

## 2017-09-13 NOTE — Anesthesia Preprocedure Evaluation (Signed)
Anesthesia Evaluation  Patient identified by MRN, date of birth, ID band Patient awake    Reviewed: Allergy & Precautions, NPO status , Patient's Chart, lab work & pertinent test results  Airway Mallampati: II  TM Distance: >3 FB Neck ROM: Full    Dental   Pulmonary asthma ,    Pulmonary exam normal        Cardiovascular Normal cardiovascular exam     Neuro/Psych    GI/Hepatic   Endo/Other    Renal/GU      Musculoskeletal   Abdominal   Peds  Hematology   Anesthesia Other Findings   Reproductive/Obstetrics                             Anesthesia Physical Anesthesia Plan  ASA: II  Anesthesia Plan: General   Post-op Pain Management:    Induction: Intravenous  PONV Risk Score and Plan: 2 and Ondansetron and Dexamethasone  Airway Management Planned: Oral ETT  Additional Equipment:   Intra-op Plan:   Post-operative Plan: Extubation in OR  Informed Consent: I have reviewed the patients History and Physical, chart, labs and discussed the procedure including the risks, benefits and alternatives for the proposed anesthesia with the patient or authorized representative who has indicated his/her understanding and acceptance.     Plan Discussed with: CRNA and Surgeon  Anesthesia Plan Comments:         Anesthesia Quick Evaluation

## 2017-09-13 NOTE — Anesthesia Postprocedure Evaluation (Signed)
Anesthesia Post Note  Patient: Noah Clark  Procedure(s) Performed: LAPAROSCOPIC SIGMOIDECTOMY COLECTOMY ERAS PATHWAY (N/A Abdomen)     Patient location during evaluation: PACU Anesthesia Type: General Level of consciousness: awake and alert Pain management: pain level controlled Vital Signs Assessment: post-procedure vital signs reviewed and stable Respiratory status: spontaneous breathing, nonlabored ventilation, respiratory function stable and patient connected to nasal cannula oxygen Cardiovascular status: blood pressure returned to baseline and stable Postop Assessment: no apparent nausea or vomiting Anesthetic complications: no    Last Vitals:  Vitals:   09/13/17 1600 09/13/17 1714  BP: 135/70 (!) 145/74  Pulse: 68 79  Resp: 16 18  Temp: 36.9 C 37.3 C  SpO2: 96% 96%    Last Pain:  Vitals:   09/13/17 1714  TempSrc: Oral  PainSc:                  Vlasta Baskin P Alee Gressman

## 2017-09-14 ENCOUNTER — Encounter (HOSPITAL_COMMUNITY): Payer: Self-pay | Admitting: General Surgery

## 2017-09-14 LAB — CBC
HEMATOCRIT: 39.9 % (ref 39.0–52.0)
HEMOGLOBIN: 13.6 g/dL (ref 13.0–17.0)
MCH: 30.1 pg (ref 26.0–34.0)
MCHC: 34.1 g/dL (ref 30.0–36.0)
MCV: 88.3 fL (ref 78.0–100.0)
Platelets: 284 10*3/uL (ref 150–400)
RBC: 4.52 MIL/uL (ref 4.22–5.81)
RDW: 12.8 % (ref 11.5–15.5)
WBC: 12.1 10*3/uL — AB (ref 4.0–10.5)

## 2017-09-14 LAB — BASIC METABOLIC PANEL
ANION GAP: 7 (ref 5–15)
BUN: 9 mg/dL (ref 6–20)
CHLORIDE: 106 mmol/L (ref 101–111)
CO2: 24 mmol/L (ref 22–32)
Calcium: 9.1 mg/dL (ref 8.9–10.3)
Creatinine, Ser: 0.93 mg/dL (ref 0.61–1.24)
GFR calc Af Amer: >60 mL/min (ref >60)
GFR calc non Af Amer: >60 mL/min (ref >60)
GLUCOSE: 120 mg/dL — AB (ref 65–99)
POTASSIUM: 4.3 mmol/L (ref 3.5–5.1)
SODIUM: 137 mmol/L (ref 135–145)

## 2017-09-14 MED ORDER — TRAMADOL HCL 50 MG PO TABS: 50.0000 mg | ORAL_TABLET | Freq: Four times a day (QID) | ORAL | 0 refills | Status: DC | PRN

## 2017-09-14 MED ORDER — TRAMADOL HCL 50 MG PO TABS: 50.0000 mg | ORAL_TABLET | Freq: Four times a day (QID) | ORAL | Status: DC | PRN

## 2017-09-14 NOTE — Progress Notes (Signed)
Spoke with patient at bedside. Patient denies [place] needs, has PCP. Hoping for early d/c. Will continue to follow. [phone]

## 2017-09-14 NOTE — Discharge Instructions (Signed)

## 2017-09-14 NOTE — Progress Notes (Signed)
1 Day Post-Op lap sigmoidectomy Subjective: Did well overnight, pain controlled with tylenol.  No nausea.  Passing flatus, having BM's.  Tolerating liquids  Objective: Vital signs in last 24 hours: Temp:  [98 F (36.7 C)-99.5 F (37.5 C)] 98.6 F (37 C) (01/11 0656) Pulse Rate:  [55-89] 68 (01/11 0656) Resp:  [12-20] 18 (01/11 0656) BP: (128-149)/(70-90) 144/87 (01/11 0656) SpO2:  [96 %-100 %] 99 % (01/11 0656) Weight:  [108.4 kg (239 lb)] 108.4 kg (239 lb) (01/10 1714)   Intake/Output from previous day: 01/10 0701 - 01/11 0700 In: 2300 [I.V.:2250; IV Piggyback:50] Out: 30 [Urine:5; Blood:25] Intake/Output this shift: Total I/O In: 800 [P.O.:800] Out: -    General appearance: alert and cooperative GI: normal findings: soft, non-tender ,  Incision: no significant drainage  Lab Results:  Recent Labs    09/11/17 1225 09/14/17 0512  WBC 8.0 12.1*  HGB 14.7 13.6  HCT 42.0 39.9  PLT 294 284   BMET Recent Labs    09/14/17 0512  NA 137  K 4.3  CL 106  CO2 24  GLUCOSE 120*  BUN 9  CREATININE 0.93  CALCIUM 9.1   PT/INR No results for input(s): LABPROT, INR in the last 72 hours. ABG No results for input(s): PHART, HCO3 in the last 72 hours.  Invalid input(s): PCO2, PO2  MEDS, Scheduled . acetaminophen  1,000 mg Oral Q6H  . alvimopan  12 mg Oral BID  . enoxaparin (LOVENOX) injection  40 mg Subcutaneous Q24H  . gabapentin  300 mg Oral BID    Studies/Results: No results found.  Assessment: s/p Procedure(s): LAPAROSCOPIC SIGMOIDECTOMY COLECTOMY ERAS PATHWAY Patient Active Problem List   Diagnosis Date Noted  . Colon cancer ([place]) 09/13/2017    Expected post op course  Plan: Advance diet to soft foods SL MIV Ambulate Tramadol PRN   LOS: 1 day     .Rosario Adie, [facility], [place]   09/14/2017 9:57 AM

## 2017-09-14 NOTE — Progress Notes (Signed)
Pharmacy Brief Note - Alvimopan (Entereg)  The standing order set for alvimopan (Entereg) now includes an automatic order to discontinue the drug after the patient has had a bowel movement. The change was approved by the [place] and the Medical Executive Committee.   This patient has had bowel movements documented by nursing. Therefore, alvimopan has been discontinued. If there are questions, please contact the pharmacy at [phone].   Thank you-  Peggyann Juba, PharmD, BCPS 09/14/2017 1:52 PM

## 2017-09-15 LAB — CBC
HCT: 38.9 % — ABNORMAL LOW (ref 39.0–52.0)
Hemoglobin: 13.2 g/dL (ref 13.0–17.0)
MCH: 30.2 pg (ref 26.0–34.0)
MCHC: 33.9 g/dL (ref 30.0–36.0)
MCV: 89 fL (ref 78.0–100.0)
PLATELETS: 273 10*3/uL (ref 150–400)
RBC: 4.37 MIL/uL (ref 4.22–5.81)
RDW: 13 % (ref 11.5–15.5)
WBC: 7.8 10*3/uL (ref 4.0–10.5)

## 2017-09-15 LAB — BASIC METABOLIC PANEL
Anion gap: 6 (ref 5–15)
BUN: 10 mg/dL (ref 6–20)
CALCIUM: 8.8 mg/dL — AB (ref 8.9–10.3)
CO2: 25 mmol/L (ref 22–32)
CREATININE: 0.93 mg/dL (ref 0.61–1.24)
Chloride: 107 mmol/L (ref 101–111)
GFR calc non Af Amer: >60 mL/min (ref >60)
Glucose, Bld: 97 mg/dL (ref 65–99)
Potassium: 4.4 mmol/L (ref 3.5–5.1)
SODIUM: 138 mmol/L (ref 135–145)

## 2017-09-15 MED ORDER — HYDROCODONE-ACETAMINOPHEN 5-325 MG PO TABS: 1.0000 | ORAL_TABLET | Freq: Four times a day (QID) | ORAL | 0 refills | Status: DC | PRN

## 2017-09-15 NOTE — Plan of Care (Signed)
Pt met goals and is ok to discharge

## 2017-09-15 NOTE — Progress Notes (Signed)
2 Days Post-Op   Subjective/Chief Complaint: No complaints   Objective: Vital signs in last 24 hours: Temp:  [98.4 F (36.9 C)-99.4 F (37.4 C)] 98.4 F (36.9 C) (01/12 0545) Pulse Rate:  [46-54] 54 (01/12 0545) Resp:  [18] 18 (01/12 0545) BP: (101-130)/(49-75) 121/73 (01/12 0545) SpO2:  [92 %-99 %] 96 % (01/12 0545) Last BM Date: 09/15/17  Intake/Output from previous day: 01/11 0701 - 01/12 0700 In: 3730 [P.O.:1280; I.V.:2450] Out: -  Intake/Output this shift: Total I/O In: 560 [P.O.:560] Out: -   General appearance: alert and cooperative Resp: clear to auscultation bilaterally Cardio: regular rate and rhythm GI: soft, mild tenderness. incisions look good  Lab Results:  Recent Labs    09/14/17 0512 09/15/17 0442  WBC 12.1* 7.8  HGB 13.6 13.2  HCT 39.9 38.9*  PLT 284 273   BMET Recent Labs    09/14/17 0512 09/15/17 0442  NA 137 138  K 4.3 4.4  CL 106 107  CO2 24 25  GLUCOSE 120* 97  BUN 9 10  CREATININE 0.93 0.93  CALCIUM 9.1 8.8*   PT/INR No results for input(s): LABPROT, INR in the last 72 hours. ABG No results for input(s): PHART, HCO3 in the last 72 hours.  Invalid input(s): PCO2, PO2  Studies/Results: No results found.  Anti-infectives: Anti-infectives (From admission, onward)   Start     Dose/Rate Route Frequency Ordered Stop   09/13/17 2200  cefoTEtan (CEFOTAN) 2 g in dextrose 5 % 50 mL IVPB     2 g 100 mL/hr over 30 Minutes Intravenous Every 12 hours 09/13/17 1356 09/13/17 2200   09/13/17 0853  cefoTEtan in Dextrose 5% (CEFOTAN) IVPB 2 g     2 g Intravenous On call to O.R. 09/13/17 0853 09/13/17 1046      Assessment/Plan: s/p Procedure(s): LAPAROSCOPIC SIGMOIDECTOMY COLECTOMY ERAS PATHWAY (N/A) Advance diet Discharge  LOS: 2 days    TOTH III,PAUL S 09/15/2017

## 2017-09-15 NOTE — Plan of Care (Signed)
Cont to monitor

## 2017-09-17 NOTE — Discharge Summary (Signed)
Patient ID: Infant Zink Budhu [id number] 52 y.o. 06-02-1966  09/13/2017  Discharge date and time: 09/15/2017  1:55 PM  Admitting Physician: Rosario Adie  Discharge Physician: Rosario Adie.  Admission Diagnoses: Colon cancer Discharge Diagnoses: Colon cancer  Operations:  LAPAROSCOPIC SIGMOIDECTOMY COLECTOMY   Discharged Condition: good    Hospital Course: Pt admitted after surgery.  Diet was advanced as tolerated.  He was discharged to home on POD 2 in stable condition  Consults: None  Significant Diagnostic Studies: labs: cbc  Treatments: IV hydration, analgesia: acetaminophen and surgery: lap sigmoidectomy  Disposition: Home

## 2018-02-28 ENCOUNTER — Other Ambulatory Visit: Payer: Self-pay | Admitting: General Surgery

## 2018-02-28 DIAGNOSIS — C187 Malignant neoplasm of sigmoid colon: Secondary | ICD-10-CM

## 2018-02-28 DIAGNOSIS — R911 Solitary pulmonary nodule: Secondary | ICD-10-CM

## 2018-04-05 ENCOUNTER — Ambulatory Visit
Admission: RE | Admit: 2018-04-05 | Discharge: 2018-04-05 | Disposition: A | Discharge status: Home / Self Care | Payer: 59 | Source: Ambulatory Visit | Attending: General Surgery | Admitting: General Surgery

## 2018-04-05 ENCOUNTER — Other Ambulatory Visit: Payer: Self-pay | Admitting: General Surgery

## 2018-04-05 DIAGNOSIS — R911 Solitary pulmonary nodule: Secondary | ICD-10-CM

## 2018-04-05 DIAGNOSIS — C189 Malignant neoplasm of colon, unspecified: Secondary | ICD-10-CM | POA: Diagnosis not present

## 2018-04-05 DIAGNOSIS — R918 Other nonspecific abnormal finding of lung field: Secondary | ICD-10-CM | POA: Diagnosis not present

## 2018-04-05 DIAGNOSIS — C187 Malignant neoplasm of sigmoid colon: Secondary | ICD-10-CM

## 2018-04-05 MED ORDER — IOPAMIDOL (ISOVUE-300) INJECTION 61%
75.0000 mL | Freq: Once | INTRAVENOUS | Status: AC | PRN
  Administered 2018-04-05: 75 mL via INTRAVENOUS

## 2018-04-05 NOTE — Progress Notes (Signed)
Spoke with [name] at [facility] surgery center and pt does not need CT scan of Abd and pelvis with CT chest today. Dr. Marcello Moores requests follow up CT chest with contrast be done as a walk in today

## 2018-04-08 ENCOUNTER — Other Ambulatory Visit: Payer: 59

## 2018-04-08 ENCOUNTER — Other Ambulatory Visit: Payer: Self-pay | Admitting: General Surgery

## 2018-04-08 DIAGNOSIS — C187 Malignant neoplasm of sigmoid colon: Secondary | ICD-10-CM

## 2018-10-11 DIAGNOSIS — Z85038 Personal history of other malignant neoplasm of large intestine: Secondary | ICD-10-CM | POA: Diagnosis not present

## 2018-10-11 DIAGNOSIS — Z86010 Personal history of colonic polyps: Secondary | ICD-10-CM | POA: Diagnosis not present

## 2018-10-11 DIAGNOSIS — Z1211 Encounter for screening for malignant neoplasm of colon: Secondary | ICD-10-CM | POA: Diagnosis not present

## 2018-10-21 DIAGNOSIS — Z85038 Personal history of other malignant neoplasm of large intestine: Secondary | ICD-10-CM | POA: Diagnosis not present

## 2018-12-15 IMAGING — CT CT CHEST W/ CM
3 series · 15 of 30 positions shown, 19 images · IV contrast (APPLIED)
Comparison: None.

CLINICAL DATA: Newly diagnosed sigmoid colon carcinoma by
colonoscopy. Staging.

EXAM:
CT CHEST, ABDOMEN, AND PELVIS WITH CONTRAST
TECHNIQUE: Multidetector CT imaging of the chest, abdomen and pelvis was
performed following the standard protocol during bolus
administration of i in right middle lobe and lingula. Ntravenous
contrast.
CONTRAST:  125mL [id number] IOPAMIDOL ([id number]) INJECTION 61%

[Series 2: chest/abd/pelvis w/cm · axial · 0.80mm/px · z∈[-708,-102]mm · 8 of 236 slices shown]
[im 17/236  mediastinal]
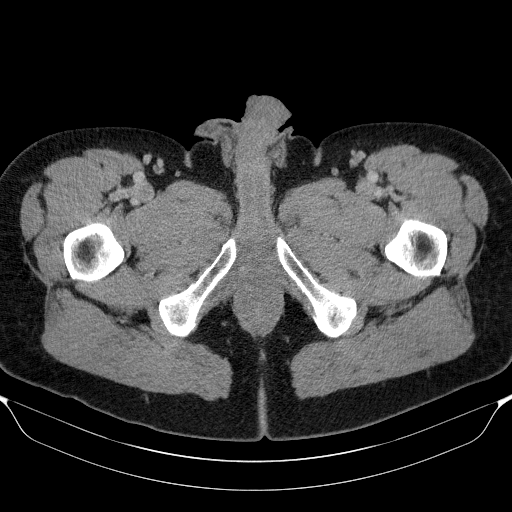
[im 51/236  mediastinal]
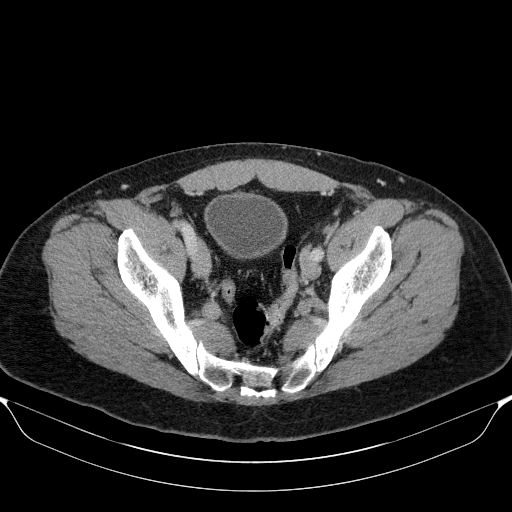
[im 84/236  mediastinal]
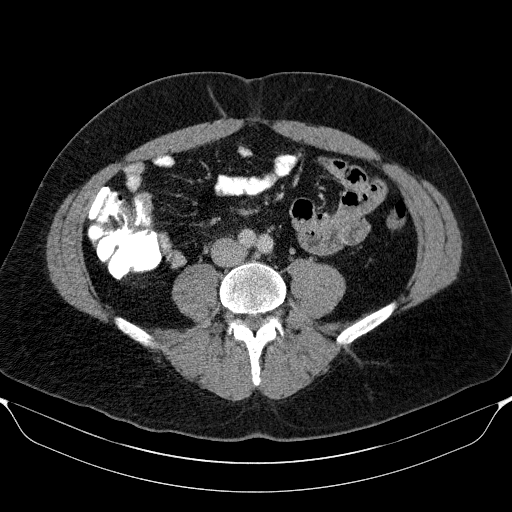
[im 116/236  mediastinal]
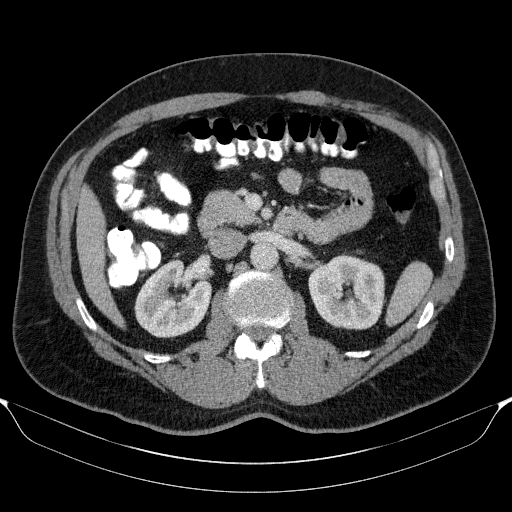
[im 118/236  mediastinal]
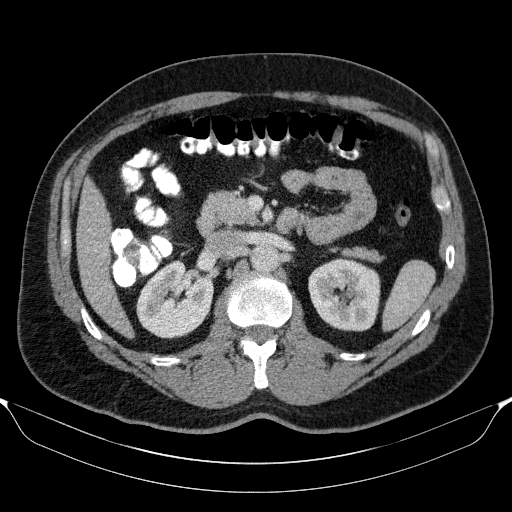
[im 152/236  mediastinal]
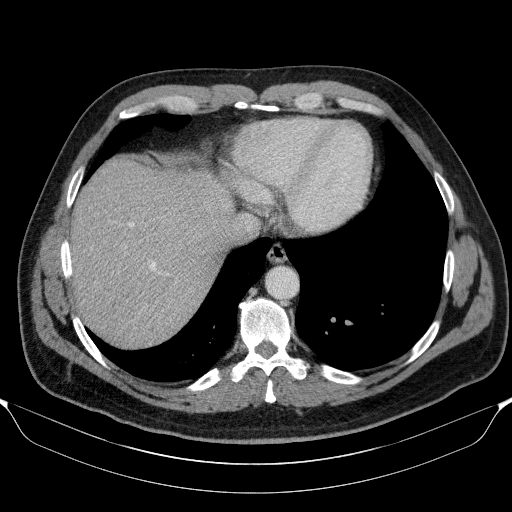
[im 185/236  mediastinal]
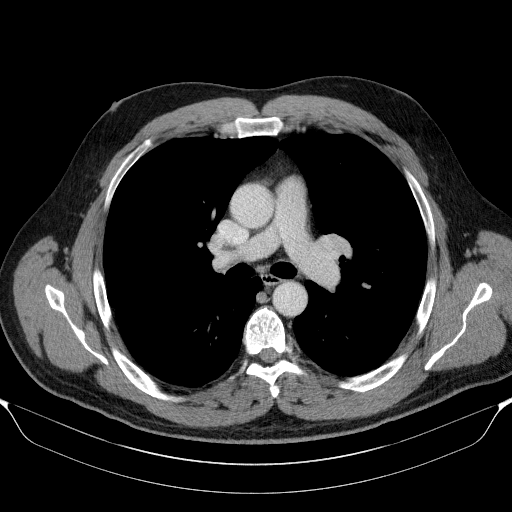
[im 219/236  mediastinal]
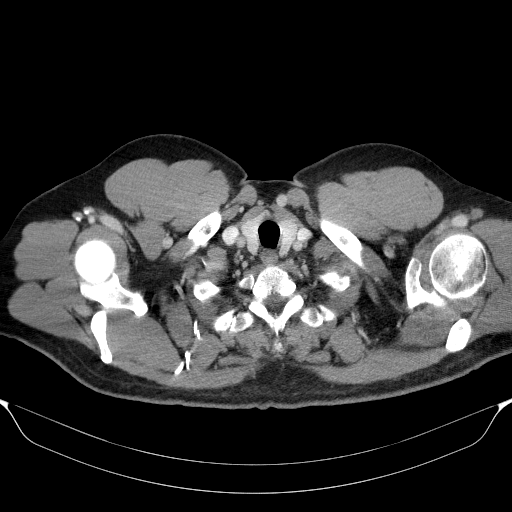

[Series 6: lung · axial · 0.62mm/px · z∈[-375,-267]mm · 4 of 180 slices shown]
[im 18/180  bone]
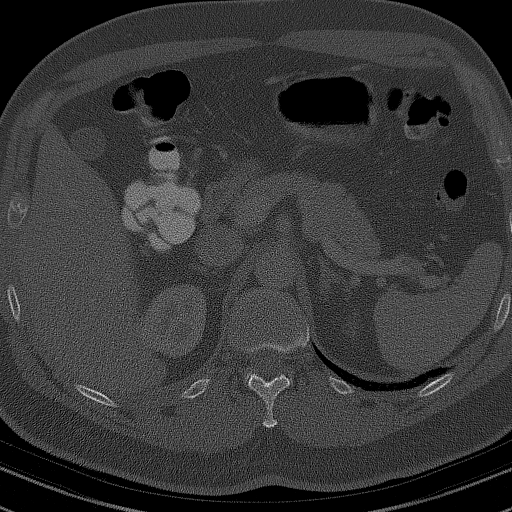
[im 36/180  bone]
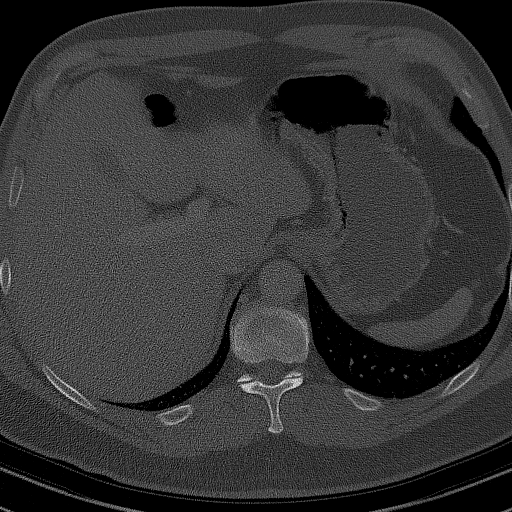
[im 54/180  bone]
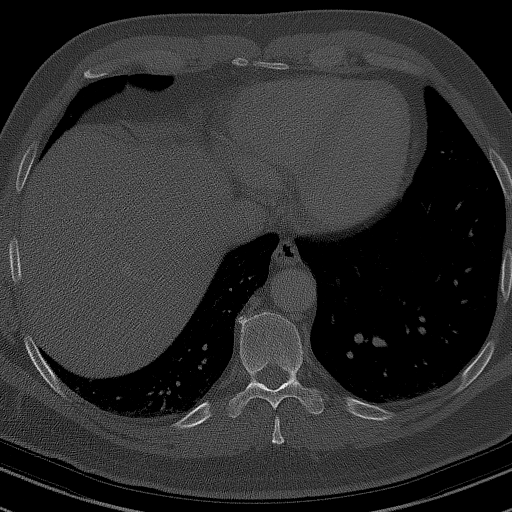
[im 72/180  bone]
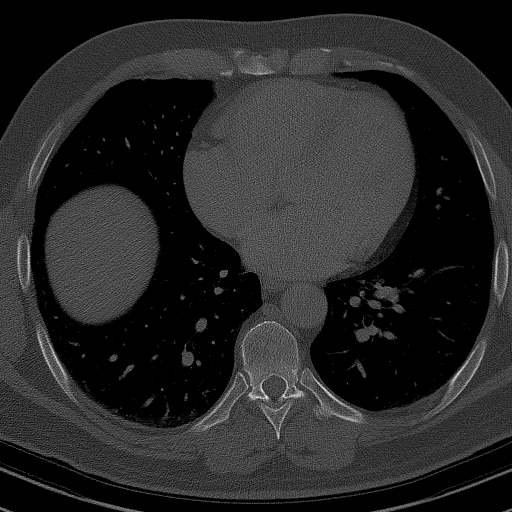

[Series 7: renal delay · axial · delayed · 0.80mm/px · z∈[-492,-362]mm · 3 of 27 slices shown, 7 images]
[im 1/27  mediastinal]
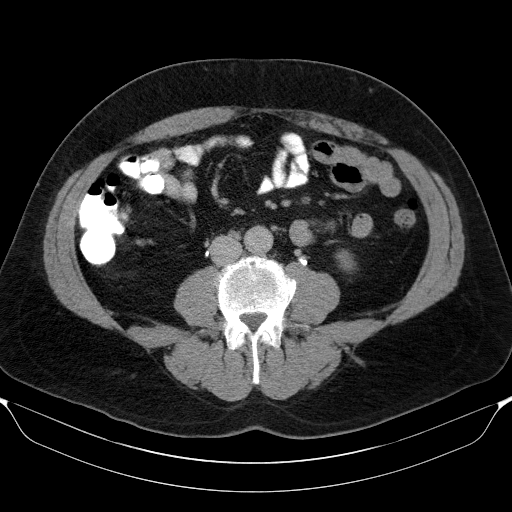
[im 1/27  lung]
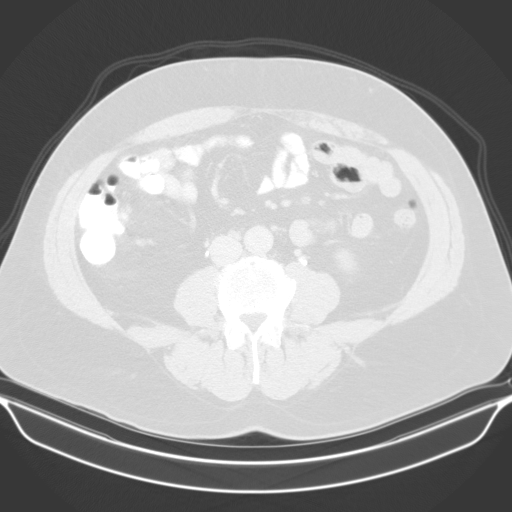
[im 1/27  bone]
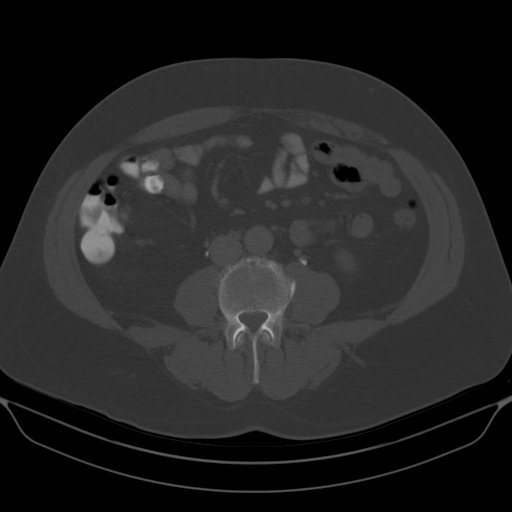
[im 17/27  mediastinal]
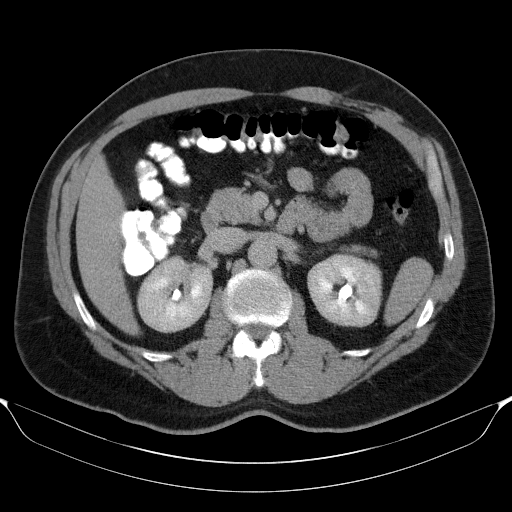
[im 17/27  lung]
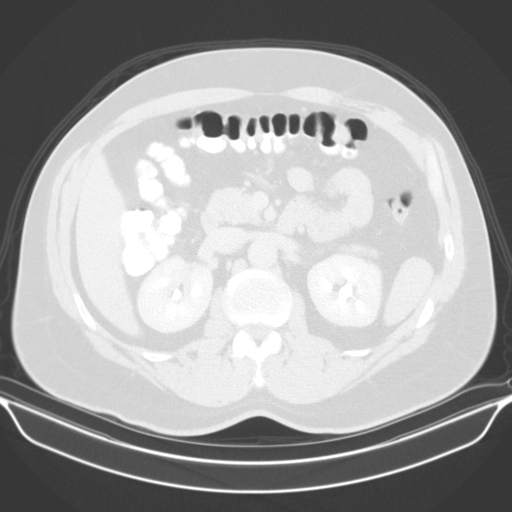
[im 27/27  mediastinal]
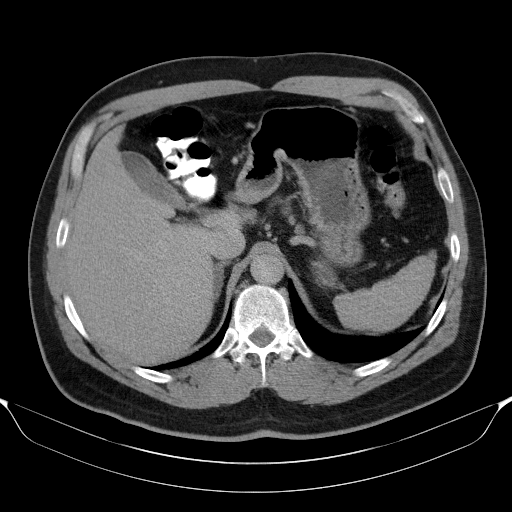
[im 27/27  lung]
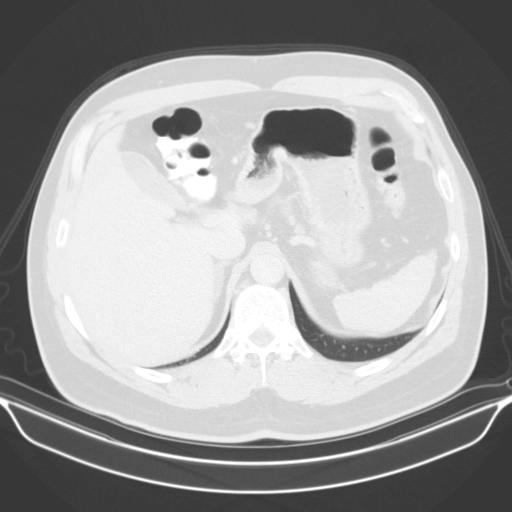

[15 of 30 positions shown; findings below may reference images not displayed]

FINDINGS: CT CHEST FINDINGS

Cardiovascular: No acute findings.

Mediastinum/Lymph Nodes: No masses or pathologically enlarged lymph
nodes identified.

Lungs/Pleura: 5 mm pulmonary nodule is seen in the right middle lobe
on image 81/6, and 6 mm pulmonary nodule seen in the lingula on
image 93/6. These cannot be characterized due to their small size
and are therefore indeterminate. No evidence of pulmonary infiltrate
or pleural effusion.

Musculoskeletal:  No suspicious bone lesions identified.

CT ABDOMEN AND PELVIS FINDINGS

Hepatobiliary: No masses identified.

Pancreas:  No mass or inflammatory changes.

Spleen:  Within normal limits in size and appearance.

Adrenals/Urinary tract: Tiny less than 1 cm subcapsular cysts in
lower poles of both kidneys. No masses or hydronephrosis.

Stomach/Bowel: Radiopaque marker seen in the mid to distal sigmoid
colon, however no definite soft tissue mass visualized by CT. No
evidence of enlarged pericolonic lymph nodes. No evidence of
obstruction, inflammatory process, or abnormal fluid collections.
Normal appendix visualized.

Vascular/Lymphatic: No pathologically enlarged lymph nodes
identified. No abdominal aortic aneurysm. Aortic atherosclerosis.

Reproductive:  No mass or other significant abnormality identified.

Other:  None.

Musculoskeletal:  No suspicious bone lesions identified.
IMPRESSION: No definite evidence of metastatic disease within the chest,
abdomen, or pelvis.

Indeterminate sub-cm pulmonary nodules in right middle lobe and
lingula. Recommend continued followup by chest CT without contrast
in 6 months.

## 2019-02-05 DIAGNOSIS — R079 Chest pain, unspecified: Secondary | ICD-10-CM | POA: Diagnosis not present

## 2019-02-05 DIAGNOSIS — R0789 Other chest pain: Secondary | ICD-10-CM | POA: Diagnosis not present

## 2019-02-06 DIAGNOSIS — R0789 Other chest pain: Secondary | ICD-10-CM | POA: Diagnosis not present

## 2019-02-06 DIAGNOSIS — R079 Chest pain, unspecified: Secondary | ICD-10-CM | POA: Diagnosis not present

## 2019-02-06 DIAGNOSIS — R001 Bradycardia, unspecified: Secondary | ICD-10-CM

## 2019-02-12 ENCOUNTER — Other Ambulatory Visit: Payer: Self-pay

## 2019-02-12 ENCOUNTER — Encounter: Payer: Self-pay | Admitting: Cardiology

## 2019-02-12 ENCOUNTER — Ambulatory Visit (INDEPENDENT_AMBULATORY_CARE_PROVIDER_SITE_OTHER): Payer: 59 | Admitting: Cardiology

## 2019-02-12 VITALS — BP 142/78 | HR 61 | Ht 72.0 in | Wt 258.0 lb

## 2019-02-12 DIAGNOSIS — R0789 Other chest pain: Secondary | ICD-10-CM | POA: Insufficient documentation

## 2019-02-12 DIAGNOSIS — R079 Chest pain, unspecified: Secondary | ICD-10-CM | POA: Insufficient documentation

## 2019-02-12 DIAGNOSIS — E782 Mixed hyperlipidemia: Secondary | ICD-10-CM

## 2019-02-12 DIAGNOSIS — C189 Malignant neoplasm of colon, unspecified: Secondary | ICD-10-CM | POA: Diagnosis not present

## 2019-02-12 DIAGNOSIS — R011 Cardiac murmur, unspecified: Secondary | ICD-10-CM

## 2019-02-12 HISTORY — DX: Mixed hyperlipidemia: E78.2

## 2019-02-12 HISTORY — DX: Chest pain, unspecified: R07.9

## 2019-02-12 NOTE — Progress Notes (Signed)
Cardiology Office Note:    Date:  02/12/2019   ID:  Noah Clark, DOB 1966-01-02, MRN [id number]  PCP:  Physicians, [place]  Cardiologist:  Jenean Lindau, MD   Referring MD: Physicians, [place]:    1. Chest pain, unspecified type   2. Chest discomfort   3. Mixed dyslipidemia   4. Malignant neoplasm of colon, unspecified part of colon ([place])    PLAN:    In order of problems listed above:  1. Chest discomfort: I reassured the patient about my findings.  I discussed stress test and EKG findings at extensive length and he vocalized understanding.  I told him to initiate a walking program on a daily basis and not to push himself back to it in a graded and stepwise fashion.  He is sexually active and this does not bring around any of the symptoms. 2. Mixed dyslipidemia: I will get his lab work obtained recently by his primary care physician and evaluate.  He was told that his cholesterol is elevated. 3. For his stratification I discussed CT calcium scoring and he is agreeable and we will set it up at [facility]. 4. Follow-up appointment in a month or earlier if he has any concerns.  He knows to go to the nearest emergency room for any concerning symptoms.  Diet was also discussed at extensive length and questions were answered to his satisfaction.   Medication Adjustments/Labs and Tests Ordered: Current medicines are reviewed at length with the patient today.  Concerns regarding medicines are outlined above.  No orders of the defined types were placed in this encounter.  No orders of the defined types were placed in this encounter.    History of Present Illness:    Noah Clark is a 53 y.o. male who is being seen today for the evaluation of chest discomfort at the request of Physicians, [place]*.  Patient is a pleasant 53 year old male.  He has past medical history of colon cancer post resection and he tells me that he is completely cured of  this.  Patient experience chest discomfort the other day and went to the urgent care center on 2 occasions.  Subsequently was sent to the emergency room.  In the emergency room he was evaluated and admitted and stress test did not reveal any evidence of ischemia and subsequently was discharged.  His chest discomfort is very vague in nature.  No relation to exercise or exertion.  It is substernal and burning-like sensation.  No orthopnea or PND.  No radiation to any part of the body.  At the time of my evaluation, the patient is alert awake oriented and in no distress.  It seems like he is very anxious about the symptoms.  Past Medical History:  Diagnosis Date  . Anxiety   . Asthma    as a child  . Cancer [facility])    colon cancer  . Heart murmur     Past Surgical History:  Procedure Laterality Date  . LAPAROSCOPIC PARTIAL COLECTOMY N/A 09/13/2017   Procedure: LAPAROSCOPIC SIGMOIDECTOMY COLECTOMY ERAS PATHWAY;  Surgeon: Leighton Ruff, MD;  Location: [facility];  Service: General;  Laterality: N/A;  . VASECTOMY    . WISDOM TOOTH EXTRACTION      Current Medications: Current Meds  Medication Sig  . aspirin EC 81 MG tablet Take 81 mg by mouth daily.  Marland Kitchen omeprazole (PRILOSEC) 20 MG capsule Take 20 mg by mouth daily.  Allergies:   Patient has no known allergies.   Social History   Socioeconomic History  . Marital status: Married    Spouse name: Not on file  . Number of children: Not on file  . Years of education: Not on file  . Highest education level: Not on file  Occupational History  . Not on file  Social Needs  . Financial resource strain: Not on file  . Food insecurity:    Worry: Not on file    Inability: Not on file  . Transportation needs:    Medical: Not on file    Non-medical: Not on file  Tobacco Use  . Smoking status: Never Smoker  . Smokeless tobacco: Never Used  Substance and Sexual Activity  . Alcohol use: Yes    Comment: weekends  . Drug use: No  . Sexual  activity: Yes  Lifestyle  . Physical activity:    Days per week: Not on file    Minutes per session: Not on file  . Stress: Not on file  Relationships  . Social connections:    Talks on phone: Not on file    Gets together: Not on file    Attends religious service: Not on file    Active member of club or organization: Not on file    Attends meetings of clubs or organizations: Not on file    Relationship status: Not on file  Other Topics Concern  . Not on file  Social History Narrative  . Not on file     Family History: The patient's family history is not on file.  ROS:   Please see the history of present illness.    All other systems reviewed and are negative.  EKGs/Labs/Other Studies Reviewed:    The following studies were reviewed today: I reviewed [facility] extensively.  EKG reveals sinus rhythm and nonspecific ST-T changes and stress test did not reveal any evidence of ischemia.  This was a nuclear sestamibi Lexiscan stress test with normal ejection fraction.   Recent Labs: No results found for requested labs within last 8760 hours.  Recent Lipid Panel No results found for: CHOL, TRIG, HDL, CHOLHDL, VLDL, LDLCALC, LDLDIRECT  Physical Exam:    VS:  BP (!) 142/78 (BP Location: Left Arm, Patient Position: Sitting, Cuff Size: Normal)   Pulse 61   Ht 6' (1.829 m)   Wt 258 lb (117 kg)   SpO2 98%   BMI 34.99 kg/m     Wt Readings from Last 3 Encounters:  02/12/19 258 lb (117 kg)  09/13/17 239 lb (108.4 kg)  09/11/17 237 lb (107.5 kg)     GEN: Patient is in no acute distress HEENT: Normal NECK: No JVD; No carotid bruits LYMPHATICS: No lymphadenopathy CARDIAC: S1 S2 regular, 2/6 systolic murmur at the apex. RESPIRATORY:  Clear to auscultation without rales, wheezing or rhonchi  ABDOMEN: Soft, non-tender, non-distended MUSCULOSKELETAL:  No edema; No deformity  SKIN: Warm and dry NEUROLOGIC:  Alert and oriented x 3 PSYCHIATRIC:  Normal affect     Signed, Jenean Lindau, MD  02/12/2019 3:25 PM    [facility]

## 2019-02-12 NOTE — Patient Instructions (Addendum)
Medication Instructions:  Your physician recommends that you continue on your current medications as directed. Please refer to the Current Medication list given to you today.  If you need a refill on your cardiac medications before your next appointment, please call your pharmacy.   Lab work: NONE If you have labs (blood work) drawn today and your tests are completely normal, you will receive your results only by: Marland Kitchen MyChart Message (if you have MyChart) OR . A paper copy in the mail If you have any lab test that is abnormal or we need to change your treatment, we will call you to review the results.  Testing/Procedures: Your physician has requested that you have an echocardiogram. YOU HAVE BEEN SCHEDULED FOR THIS PROCEDURE ON March 21, 2019 at 10:15. Echocardiography is a painless test that uses sound waves to create images of your heart. It provides your doctor with information about the size and shape of your heart and how well your heart's chambers and valves are working. This procedure takes approximately one hour. There are no restrictions for this procedure.  You will be called by [facility] to schedule a ct cardiac score procedure.  Follow-Up: At [facility], you and your health needs are our priority.  As part of our continuing mission to provide you with exceptional heart care, we have created designated Provider Care Teams.  These Care Teams include your primary Cardiologist (physician) and Advanced Practice Providers (APPs -  Physician Assistants and Nurse Practitioners) who all work together to provide you with the care you need, when you need it. You will need a follow up appointment in 1 months.     Any Other Special Instructions Will Be Listed Below    Coronary Calcium Scan A coronary calcium scan is an imaging test used to look for deposits of calcium and other fatty materials (plaques) in the inner lining of the blood vessels of the heart (coronary arteries). These  deposits of calcium and plaques can partly clog and narrow the coronary arteries without producing any symptoms or warning signs. This puts a person at risk for a heart attack. This test can detect these deposits before symptoms develop. Tell a health care provider about:  Any allergies you have.  All medicines you are taking, including vitamins, herbs, eye drops, creams, and over-the-counter medicines.  Any problems you or family members have had with anesthetic medicines.  Any blood disorders you have.  Any surgeries you have had.  Any medical conditions you have.  Whether you are pregnant or may be pregnant. What are the risks? Generally, this is a safe procedure. However, problems may occur, including:  Harm to a pregnant woman and her unborn baby. This test involves the use of radiation. Radiation exposure can be dangerous to a pregnant woman and her unborn baby. If you are pregnant, you generally should not have this procedure done.  Slight increase in the risk of cancer. This is because of the radiation involved in the test. What happens before the procedure? No preparation is needed for this procedure. What happens during the procedure?   You will undress and remove any jewelry around your neck or chest.  You will put on a hospital gown.  Sticky electrodes will be placed on your chest. The electrodes will be connected to an electrocardiogram (ECG) machine to record a tracing of the electrical activity of your heart.  A CT scanner will take pictures of your heart. During this time, you will be asked to  lie still and hold your breath for 2-3 seconds while a picture of your heart is being taken. The procedure may vary among health care providers and hospitals. What happens after the procedure?  You can get dressed.  You can return to your normal activities.  It is up to you to get the results of your test. Ask your health care provider, or the department that is doing  the test, when your results will be ready. Summary  A coronary calcium scan is an imaging test used to look for deposits of calcium and other fatty materials (plaques) in the inner lining of the blood vessels of the heart (coronary arteries).  Generally, this is a safe procedure. Tell your health care provider if you are pregnant or may be pregnant.  No preparation is needed for this procedure.  A CT scanner will take pictures of your heart.  You can return to your normal activities after the scan is done. This information is not intended to replace advice given to you by your health care provider. Make sure you discuss any questions you have with your health care provider. Document Released: 02/17/2008 Document Revised: 07/10/2016 Document Reviewed: 07/10/2016 Elsevier Interactive Patient Education  2019 [employer]. Echocardiogram An echocardiogram is a procedure that uses painless sound waves (ultrasound) to produce an image of the heart. Images from an echocardiogram can provide important information about:  Signs of coronary artery disease (CAD).  Aneurysm detection. An aneurysm is a weak or damaged part of an artery wall that bulges out from the normal force of blood pumping through the body.  Heart size and shape. Changes in the size or shape of the heart can be associated with certain conditions, including heart failure, aneurysm, and CAD.  Heart muscle function.  Heart valve function.  Signs of a past heart attack.  Fluid buildup around the heart.  Thickening of the heart muscle.  A tumor or infectious growth around the heart valves. Tell a health care provider about:  Any allergies you have.  All medicines you are taking, including vitamins, herbs, eye drops, creams, and over-the-counter medicines.  Any blood disorders you have.  Any surgeries you have had.  Any medical conditions you have.  Whether you are pregnant or may be pregnant. What are the risks?  Generally, this is a safe procedure. However, problems may occur, including:  Allergic reaction to dye (contrast) that may be used during the procedure. What happens before the procedure? No specific preparation is needed. You may eat and drink normally. What happens during the procedure?   An IV tube may be inserted into one of your veins.  You may receive contrast through this tube. A contrast is an injection that improves the quality of the pictures from your heart.  A gel will be applied to your chest.  A wand-like tool (transducer) will be moved over your chest. The gel will help to transmit the sound waves from the transducer.  The sound waves will harmlessly bounce off of your heart to allow the heart images to be captured in real-time motion. The images will be recorded on a computer. The procedure may vary among health care providers and hospitals. What happens after the procedure?  You may return to your normal, everyday life, including diet, activities, and medicines, unless your health care provider tells you not to do that. Summary  An echocardiogram is a procedure that uses painless sound waves (ultrasound) to produce an image of the heart.  Images  from an echocardiogram can provide important information about the size and shape of your heart, heart muscle function, heart valve function, and fluid buildup around your heart.  You do not need to do anything to prepare before this procedure. You may eat and drink normally.  After the echocardiogram is completed, you may return to your normal, everyday life, unless your health care provider tells you not to do that. This information is not intended to replace advice given to you by your health care provider. Make sure you discuss any questions you have with your health care provider. Document Released: 08/18/2000 Document Revised: 09/23/2016 Document Reviewed: 09/23/2016 Elsevier Interactive Patient Education  2019  [employer].

## 2019-02-12 NOTE — Addendum Note (Signed)
Addended by: Beckey Rutter on: 02/12/2019 03:50 PM   Modules accepted: Orders

## 2019-02-14 ENCOUNTER — Telehealth: Payer: Self-pay | Admitting: *Deleted

## 2019-02-14 NOTE — Telephone Encounter (Signed)
Faxed last last office note from 6/10 to [place]

## 2019-03-19 ENCOUNTER — Telehealth: Payer: 59 | Admitting: Cardiology

## 2019-03-21 ENCOUNTER — Ambulatory Visit (INDEPENDENT_AMBULATORY_CARE_PROVIDER_SITE_OTHER): Payer: 59

## 2019-03-21 ENCOUNTER — Other Ambulatory Visit: Payer: Self-pay

## 2019-03-21 DIAGNOSIS — R011 Cardiac murmur, unspecified: Secondary | ICD-10-CM

## 2019-03-21 DIAGNOSIS — E782 Mixed hyperlipidemia: Secondary | ICD-10-CM | POA: Diagnosis not present

## 2019-03-21 DIAGNOSIS — R0789 Other chest pain: Secondary | ICD-10-CM

## 2019-03-21 NOTE — Progress Notes (Signed)
Complete echocardiogram has been performed.  Jimmy eel RDCS, RVT

## 2019-03-25 ENCOUNTER — Telehealth: Payer: Self-pay

## 2019-03-25 NOTE — Telephone Encounter (Signed)
Results relayed. Patient does not currently take any medications and scheduled for f/u to discuss treatment tomorrow at 1120.

## 2019-03-25 NOTE — Telephone Encounter (Signed)
-----   Message from Jenean Lindau, MD sent at 03/24/2019  1:26 PM EDT ----- Continue current medications.  Ejection fraction is mildly diminished.  Will discuss more at appointment. Jenean Lindau, MD 03/24/2019 1:26 PM

## 2019-03-26 ENCOUNTER — Telehealth (INDEPENDENT_AMBULATORY_CARE_PROVIDER_SITE_OTHER): Payer: 59 | Admitting: Cardiology

## 2019-03-26 ENCOUNTER — Encounter: Payer: Self-pay | Admitting: Cardiology

## 2019-03-26 ENCOUNTER — Other Ambulatory Visit: Payer: Self-pay

## 2019-03-26 VITALS — Ht 72.0 in | Wt 240.0 lb

## 2019-03-26 DIAGNOSIS — R0789 Other chest pain: Secondary | ICD-10-CM | POA: Diagnosis not present

## 2019-03-26 DIAGNOSIS — E782 Mixed hyperlipidemia: Secondary | ICD-10-CM | POA: Diagnosis not present

## 2019-03-26 NOTE — Progress Notes (Signed)
Virtual Visit via Telephone Note   This visit type was conducted due to national recommendations for restrictions regarding the COVID-19 Pandemic (e.g. social distancing) in an effort to limit this patient's exposure and mitigate transmission in our community.  Due to his co-morbid illnesses, this patient is at least at moderate risk for complications without adequate follow up.  This format is felt to be most appropriate for this patient at this time.  The patient did not have access to video technology/had technical difficulties with video requiring transitioning to audio format only (telephone).  All issues noted in this document were discussed and addressed.  No physical exam could be performed with this format.  Please refer to the patient's chart for his  consent to telehealth for [facility].   Date:  03/26/2019   ID:  Noah Clark, DOB 15-Jul-1966, MRN [id number]  Patient Location: Home Provider Location: Office  PCP:  [facility], [place]  Cardiologist:  No primary care provider on file.  Electrophysiologist:  None   Evaluation Performed:  Follow-Up Visit  Chief Complaint: Mixed dyslipidemia  History of Present Illness:    Noah Clark is a 53 y.o. male with past medical history of mixed dyslipidemia.  He denies any problems at this time and takes care of activities of daily living.  No chest pain orthopnea or PND.  His recent echocardiogram revealed mildly diminished ejection fraction and the details are mentioned below.  The patient does not have symptoms concerning for COVID-19 infection (fever, chills, cough, or new shortness of breath).    Past Medical History:  Diagnosis Date  . Anxiety   . Asthma    as a child  . Cancer [facility])    colon cancer  . Heart murmur    Past Surgical History:  Procedure Laterality Date  . LAPAROSCOPIC PARTIAL COLECTOMY N/A 09/13/2017   Procedure: LAPAROSCOPIC SIGMOIDECTOMY COLECTOMY ERAS PATHWAY;  Surgeon: Leighton Ruff, MD;   Location: [facility];  Service: General;  Laterality: N/A;  . VASECTOMY    . WISDOM TOOTH EXTRACTION       Current Meds  Medication Sig  . aspirin EC 81 MG tablet Take 81 mg by mouth daily.     Allergies:   Patient has no known allergies.   Social History   Tobacco Use  . Smoking status: Never Smoker  . Smokeless tobacco: Never Used  Substance Use Topics  . Alcohol use: Yes    Comment: weekends  . Drug use: No     Family Hx: The patient's family history is not on file.  ROS:   Please see the history of present illness.    As mentioned above All other systems reviewed and are negative.   Prior CV studies:   The following studies were reviewed today:  IMPRESSIONS    1. The left ventricle has mildly reduced systolic function, with an ejection fraction of 45-50 %. The cavity size was normal. Left ventricular diastolic parameters were normal.  2. The right ventricle has normal systolic function. The cavity was normal. There is no increase in right ventricular wall thickness.  3. No evidence of mitral valve stenosis.  4. Right atrial size was moderately dilated.  5. No stenosis of the aortic valve.  6. The aortic root, ascending aorta and aortic arch are normal in size and structure.  Labs/Other Tests and Data Reviewed:    EKG:  No ECG reviewed.  Recent Labs: No results found for requested labs within last 8760  hours.   Recent Lipid Panel No results found for: CHOL, TRIG, HDL, CHOLHDL, LDLCALC, LDLDIRECT  Wt Readings from Last 3 Encounters:  03/26/19 240 lb (108.9 kg)  02/12/19 258 lb (117 kg)  09/13/17 239 lb (108.4 kg)     Objective:    Vital Signs:  Ht 6' (1.829 m)   Wt 240 lb (108.9 kg)   BMI 32.55 kg/m    VITAL SIGNS:  reviewed  ASSESSMENT & PLAN:    1. Mixed dyslipidemia: Lipids are followed by primary care physician and recently he had blood work.  He will try to get a copy of this for his next time.  He is going to see his doctor for counseling  about lipids 2. Abnormal echocardiogram: This revealed mildly reduced ejection fraction and I counseled the patient about it.  Importance of compliance with diet and medication stressed importance of regular exercise stressed we will recheck this in 6 months when I see him in follow-up appointment.  COVID-19 Education: The signs and symptoms of COVID-19 were discussed with the patient and how to seek care for testing (follow up with PCP or arrange E-visit).  The importance of social distancing was discussed today.  Time:   Today, I have spent 12 minutes with the patient with telehealth technology discussing the above problems.     Medication Adjustments/Labs and Tests Ordered: Current medicines are reviewed at length with the patient today.  Concerns regarding medicines are outlined above.   Tests Ordered: No orders of the defined types were placed in this encounter.   Medication Changes: No orders of the defined types were placed in this encounter.   Follow Up:  Virtual Visit or In Person in 4 month(s)  Signed, Noah Lindau, MD  03/26/2019 11:34 AM    [place]

## 2019-03-26 NOTE — Patient Instructions (Signed)

## 2020-05-11 ENCOUNTER — Other Ambulatory Visit: Payer: Self-pay | Admitting: Cardiology

## 2020-05-11 DIAGNOSIS — R079 Chest pain, unspecified: Secondary | ICD-10-CM

## 2020-09-16 ENCOUNTER — Other Ambulatory Visit: Payer: Self-pay

## 2020-09-16 ENCOUNTER — Encounter: Payer: Self-pay | Admitting: Legal Medicine

## 2020-09-16 ENCOUNTER — Ambulatory Visit (INDEPENDENT_AMBULATORY_CARE_PROVIDER_SITE_OTHER): Payer: BC Managed Care – PPO | Admitting: Legal Medicine

## 2020-09-16 VITALS — BP 128/74 | HR 64 | Temp 97.1°F | Resp 16 | Ht 72.0 in | Wt 219.0 lb

## 2020-09-16 DIAGNOSIS — E782 Mixed hyperlipidemia: Secondary | ICD-10-CM | POA: Diagnosis not present

## 2020-09-16 DIAGNOSIS — C189 Malignant neoplasm of colon, unspecified: Secondary | ICD-10-CM | POA: Diagnosis not present

## 2020-09-16 NOTE — Progress Notes (Signed)
New Patient Office Visit  Subjective:  Patient ID: Noah Clark, male    DOB: July 31, 1966  Age: 55 y.o. MRN: [id number]  CC:  Chief Complaint  Patient presents with  . New Patient (Initial Visit)    HPI Noah Clark presents for establishment  He has colon cancer found Jan 2019 , partial colectomy and no chemo or radiation.  Repeat exams are normal, he is to see again this month.  Brady cardia and seeing Dr. Wille Glaser. "1. The left ventricle has mildly reduced systolic function, with an ejection fraction of 45-50 %. The cavity size was normal. Left ventricular diastolic parameters were normal. 2. The right ventricle has normal systolic function. The cavity was normal. There is no increase in right ventricular wall thickness. 3. No evidence of mitral valve stenosis. 4. Right atrial size was moderately dilated. 5. No stenosis of the aortic valve. 6. The aortic root, ascending aorta and aortic arch are normal in size and structure. lexiscan normal EF."  He has not seen cardiology in a while.  No chest pain or arrhythmias  Past Medical History:  Diagnosis Date  . Anxiety   . Asthma    as a child  . Cancer [facility])    colon cancer  . Colon cancer (HCC) 09/13/2017  . Heart murmur   . Mixed dyslipidemia 02/12/2019    Past Surgical History:  Procedure Laterality Date  . LAPAROSCOPIC PARTIAL COLECTOMY N/A 09/13/2017   Procedure: LAPAROSCOPIC SIGMOIDECTOMY COLECTOMY ERAS PATHWAY;  Surgeon: Romie Levee, MD;  Location: [facility];  Service: General;  Laterality: N/A;  . VASECTOMY    . WISDOM TOOTH EXTRACTION      Family History  Problem Relation Age of Onset  . Hypertension Mother   . Other Father        Car accident  . Hypertension Father   . Arthritis Father     Social History   Socioeconomic History  . Marital status: Married    Spouse name: Not on file  . Number of children: 2  . Years of education: Not on file  . Highest education level: Not on file   Occupational History  . Not on file  Tobacco Use  . Smoking status: Never Smoker  . Smokeless tobacco: Never Used  Vaping Use  . Vaping Use: Never used  Substance and Sexual Activity  . Alcohol use: Yes    Comment: weekends  . Drug use: No  . Sexual activity: Yes    Partners: Female  Other Topics Concern  . Not on file  Social History Narrative  . Not on file   Social Determinants of Health   Financial Resource Strain: Not on file  Food Insecurity: Not on file  Transportation Needs: Not on file  Physical Activity: Not on file  Stress: Not on file  Social Connections: Not on file  Intimate Partner Violence: Not on file    ROS Review of Systems  Constitutional: Negative for activity change, chills and fatigue.  HENT: Negative for congestion, ear pain and sinus pain.   Eyes: Negative for visual disturbance.  Respiratory: Positive for apnea. Negative for cough and wheezing.   Cardiovascular: Negative for chest pain, palpitations and leg swelling.  Gastrointestinal: Negative for abdominal distention and abdominal pain.  Endocrine: Negative for polyuria.  Genitourinary: Negative for difficulty urinating, dysuria and urgency.  Musculoskeletal: Negative for arthralgias.  Skin: Negative.   Neurological: Negative for dizziness and headaches.  Psychiatric/Behavioral: Negative.     Objective:  Today's Vitals: BP 128/74   Pulse 64   Temp (!) 97.1 F (36.2 C)   Resp 16   Ht 6' (1.829 m)   Wt 219 lb (99.3 kg)   SpO2 97%   BMI 29.70 kg/m   Physical Exam Vitals reviewed.  Constitutional:      General: He is not in acute distress.    Appearance: Normal appearance. He is obese.  HENT:     Right Ear: Tympanic membrane, ear canal and external ear normal.     Left Ear: Tympanic membrane, ear canal and external ear normal.     Nose: Nose normal.     Mouth/Throat:     Mouth: Mucous membranes are moist.     Pharynx: Oropharynx is clear.  Eyes:     Extraocular  Movements: Extraocular movements intact.     Conjunctiva/sclera: Conjunctivae normal.     Pupils: Pupils are equal, round, and reactive to light.  Cardiovascular:     Rate and Rhythm: Normal rate and regular rhythm.     Pulses: Normal pulses.     Heart sounds: Normal heart sounds. No murmur heard. No gallop.   Pulmonary:     Effort: Pulmonary effort is normal. No respiratory distress.     Breath sounds: No wheezing.  Abdominal:     General: Abdomen is flat. Bowel sounds are normal. There is no distension.     Palpations: Abdomen is soft.     Tenderness: There is no abdominal tenderness.  Musculoskeletal:        General: No swelling. Normal range of motion.     Cervical back: Normal range of motion and neck supple.  Skin:    General: Skin is warm and dry.     Capillary Refill: Capillary refill takes less than 2 seconds.  Neurological:     General: No focal deficit present.     Mental Status: He is alert and oriented to person, place, and time.     Cranial Nerves: No cranial nerve deficit.     Motor: No weakness.  Psychiatric:        Mood and Affect: Mood normal.        Thought Content: Thought content normal.        Judgment: Judgment normal.     Assessment & Plan:  Diagnoses and all orders for this visit: Mixed hyperlipidemia -     CBC with Differential/Platelet -     Comprehensive metabolic panel -     Lipid panel AN INDIVIDUAL CARE PLAN for hyperlipidemia/ cholesterol was established and reinforced today.  The patient's status was assessed using clinical findings on exam, lab and other diagnostic tests. The patient's disease status was assessed based on evidence-based guidelines and found to be well controlled. MEDICATIONS were reviewed. SELF MANAGEMENT GOALS have been discussed and patient's success at attaining the goal of low cholesterol was assessed. RECOMMENDATION given include regular exercise 3 days a week and low cholesterol/low fat diet. CLINICAL SUMMARY  including written plan to identify barriers unique to the patient due to social or economic  reasons was discussed. Malignant neoplasm of colon, unspecified part of colon ([place]) -     CBC with Differential/Platelet -     Comprehensive metabolic panel Patient is stable with his colon cancer and will follow up with Dr. Melina Copa.    Outpatient Encounter Medications as of 09/16/2020  Medication Sig  . [DISCONTINUED] aspirin EC 81 MG tablet Take 81 mg by mouth daily.   No facility-administered encounter  medications on file as of 09/16/2020.    Follow-up: Return in about 6 months (around 03/16/2021), or fasting.   Reinaldo Meeker, MD

## 2020-09-17 LAB — CBC WITH DIFFERENTIAL/PLATELET
Basophils Absolute: 0.1 10*3/uL (ref 0.0–0.2)
Basos: 1 %
EOS (ABSOLUTE): 0 10*3/uL (ref 0.0–0.4)
Eos: 1 %
Hematocrit: 43.7 % (ref 37.5–51.0)
Hemoglobin: 15.1 g/dL (ref 13.0–17.7)
Immature Grans (Abs): 0 10*3/uL (ref 0.0–0.1)
Immature Granulocytes: 0 %
Lymphocytes Absolute: 1.7 10*3/uL (ref 0.7–3.1)
Lymphs: 26 %
MCH: 31.6 pg (ref 26.6–33.0)
MCHC: 34.6 g/dL (ref 31.5–35.7)
MCV: 91 fL (ref 79–97)
Monocytes Absolute: 0.6 10*3/uL (ref 0.1–0.9)
Monocytes: 10 %
Neutrophils Absolute: 4.1 10*3/uL (ref 1.4–7.0)
Neutrophils: 62 %
Platelets: 313 10*3/uL (ref 150–450)
RBC: 4.78 x10E6/uL (ref 4.14–5.80)
RDW: 11.8 % (ref 11.6–15.4)
WBC: 6.6 10*3/uL (ref 3.4–10.8)

## 2020-09-17 LAB — COMPREHENSIVE METABOLIC PANEL
ALT: 19 IU/L (ref 0–44)
AST: 17 IU/L (ref 0–40)
Albumin/Globulin Ratio: 2.8 — ABNORMAL HIGH (ref 1.2–2.2)
Albumin: 5 g/dL — ABNORMAL HIGH (ref 3.8–4.9)
Alkaline Phosphatase: 56 IU/L (ref 44–121)
BUN/Creatinine Ratio: 19 (ref 9–20)
BUN: 15 mg/dL (ref 6–24)
Bilirubin Total: 0.3 mg/dL (ref 0.0–1.2)
CO2: 24 mmol/L (ref 20–29)
Calcium: 10 mg/dL (ref 8.7–10.2)
Chloride: 104 mmol/L (ref 96–106)
Creatinine, Ser: 0.8 mg/dL (ref 0.76–1.27)
GFR calc Af Amer: 117 mL/min/{1.73_m2} (ref >59)
GFR calc non Af Amer: 101 mL/min/{1.73_m2} (ref >59)
Globulin, Total: 1.8 g/dL (ref 1.5–4.5)
Glucose: 80 mg/dL (ref 65–99)
Potassium: 4.3 mmol/L (ref 3.5–5.2)
Sodium: 140 mmol/L (ref 134–144)
Total Protein: 6.8 g/dL (ref 6.0–8.5)

## 2020-09-17 LAB — CARDIOVASCULAR RISK ASSESSMENT

## 2020-09-17 LAB — LIPID PANEL
Chol/HDL Ratio: 2.5 ratio (ref 0.0–5.0)
Cholesterol, Total: 198 mg/dL (ref 100–199)
HDL: 78 mg/dL (ref >39)
LDL Chol Calc (NIH): 107 mg/dL — ABNORMAL HIGH (ref 0–99)
Triglycerides: 69 mg/dL (ref 0–149)
VLDL Cholesterol Cal: 13 mg/dL (ref 5–40)

## 2020-09-17 NOTE — Progress Notes (Signed)
Cbc normal, kidney and liver tests normal, Cholesterol LDL 107 use DASH diet lp

## 2021-01-18 ENCOUNTER — Encounter: Payer: Self-pay | Admitting: Legal Medicine

## 2021-01-18 ENCOUNTER — Telehealth (INDEPENDENT_AMBULATORY_CARE_PROVIDER_SITE_OTHER): Payer: BC Managed Care – PPO | Admitting: Legal Medicine

## 2021-01-18 VITALS — Ht 72.0 in | Wt 230.0 lb

## 2021-01-18 DIAGNOSIS — U071 COVID-19: Secondary | ICD-10-CM

## 2021-01-18 MED ORDER — MOLNUPIRAVIR EUA 200MG CAPSULE: 4.0000 | ORAL_CAPSULE | Freq: Two times a day (BID) | ORAL | 0 refills | Status: AC

## 2021-01-18 NOTE — Progress Notes (Signed)
Virtual Visit via Video Note   This visit type was conducted due to national recommendations for restrictions regarding the COVID-19 Pandemic (e.g. social distancing) in an effort to limit this patient's exposure and mitigate transmission in our community.  Due to his co-morbid illnesses, this patient is at least at moderate risk for complications without adequate follow up.  This format is felt to be most appropriate for this patient at this time.  All issues noted in this document were discussed and addressed.  A limited physical exam was performed with this format.  A verbal consent was obtained for the virtual visit.   Date:  01/18/2021   ID:  Estella Husk Ports, DOB [birth year], MRN [id number]  Patient Location: Home Provider Location: Office/Clinic  PCP:  Lillard Anes, MD   Evaluation Performed:  New Patient Evaluation  Chief Complaint:  Wife has covid and today he tested positive with home test  History of Present Illness:    ARIAS WEINERT is a 55 y.o. male with Wife has covid and today he tested positive with home test.  He is aching headache, low grade fever and dry cough.   The patient does have symptoms concerning for COVID-19 infection (fever, chills, cough, or new shortness of breath).    Past Medical History:  Diagnosis Date  . Anxiety   . Asthma    as a child  . Cancer [facility])    colon cancer  . Colon cancer ([place]) 09/13/2017  . Heart murmur   . Mixed dyslipidemia 02/12/2019    Past Surgical History:  Procedure Laterality Date  . LAPAROSCOPIC PARTIAL COLECTOMY N/A 09/13/2017   Procedure: LAPAROSCOPIC SIGMOIDECTOMY COLECTOMY ERAS PATHWAY;  Surgeon: Leighton Ruff, MD;  Location: [facility];  Service: General;  Laterality: N/A;  . VASECTOMY    . WISDOM TOOTH EXTRACTION      Family History  Problem Relation Age of Onset  . Hypertension Mother   . Other Father        Car accident  . Hypertension Father   . Arthritis Father     Social History    Socioeconomic History  . Marital status: Married    Spouse name: Not on file  . Number of children: 2  . Years of education: Not on file  . Highest education level: Not on file  Occupational History  . Not on file  Tobacco Use  . Smoking status: Never Smoker  . Smokeless tobacco: Never Used  Vaping Use  . Vaping Use: Never used  Substance and Sexual Activity  . Alcohol use: Yes    Comment: weekends  . Drug use: No  . Sexual activity: Yes    Partners: Female  Other Topics Concern  . Not on file  Social History Narrative  . Not on file   Social Determinants of Health   Financial Resource Strain: Not on file  Food Insecurity: Not on file  Transportation Needs: Not on file  Physical Activity: Not on file  Stress: Not on file  Social Connections: Not on file  Intimate Partner Violence: Not on file    No outpatient medications prior to visit.   No facility-administered medications prior to visit.    Allergies:   Patient has no known allergies.   Social History   Tobacco Use  . Smoking status: Never Smoker  . Smokeless tobacco: Never Used  Vaping Use  . Vaping Use: Never used  Substance Use Topics  . Alcohol use: Yes    Comment:  weekends  . Drug use: No     Review of Systems  Constitutional: Positive for fever. Negative for chills.  HENT: Positive for congestion and sinus pain.   Eyes: Negative for redness.  Respiratory: Positive for cough. Negative for hemoptysis.   Cardiovascular: Negative for palpitations and leg swelling.  Genitourinary: Negative for dysuria.  Musculoskeletal: Negative for myalgias.  Neurological: Negative for headaches.  Psychiatric/Behavioral: Negative.      Labs/Other Tests and Data Reviewed:    Recent Labs: 09/16/2020: ALT 19; BUN 15; Creatinine, Ser 0.80; Hemoglobin 15.1; Platelets 313; Potassium 4.3; Sodium 140   Recent Lipid Panel Lab Results  Component Value Date/Time   CHOL 198 09/16/2020 02:37 PM   TRIG 69  09/16/2020 02:37 PM   HDL 78 09/16/2020 02:37 PM   CHOLHDL 2.5 09/16/2020 02:37 PM   LDLCALC 107 (H) 09/16/2020 02:37 PM    Wt Readings from Last 3 Encounters:  01/18/21 230 lb (104.3 kg)  09/16/20 219 lb (99.3 kg)  03/26/19 240 lb (108.9 kg)     Objective:    Vital Signs:  Ht 6' (1.829 m)   Wt 230 lb (104.3 kg)   BMI 31.19 kg/m    Physical Exam reviewed  ASSESSMENT & PLAN:   Diagnoses and all orders for this visit: COVID-19 virus infection -     molnupiravir EUA 200 mg CAPS; Take 4 capsules (800 mg total) by mouth 2 (two) times daily for 5 days. Patient has positive covid test and will be treated with molnupiravir, he is to remain hydrated and call if worsening       COVID-19 Education: The signs and symptoms of COVID-19 were discussed with the patient and how to seek care for testing (follow up with PCP or arrange E-visit). The importance of social distancing was discussed today.   I spent 20 minutes dedicated to the care of this patient on the date of this encounter to include face-to-face time with the patient, as well as:   Follow Up:  In Person prn  Signed, Reinaldo Meeker, MD  01/18/2021 2:38 PM    [place]

## 2021-01-21 ENCOUNTER — Other Ambulatory Visit: Payer: Self-pay | Admitting: Legal Medicine

## 2021-01-21 ENCOUNTER — Ambulatory Visit (INDEPENDENT_AMBULATORY_CARE_PROVIDER_SITE_OTHER): Payer: BC Managed Care – PPO

## 2021-01-21 DIAGNOSIS — J029 Acute pharyngitis, unspecified: Secondary | ICD-10-CM | POA: Diagnosis not present

## 2021-01-21 DIAGNOSIS — J020 Streptococcal pharyngitis: Secondary | ICD-10-CM

## 2021-01-21 LAB — POCT RAPID STREP A (OFFICE): Rapid Strep A Screen: POSITIVE — AB

## 2021-01-21 MED ORDER — AMOXICILLIN 875 MG PO TABS: 875.0000 mg | ORAL_TABLET | Freq: Two times a day (BID) | ORAL | 0 refills | Status: AC

## 2021-03-18 ENCOUNTER — Encounter: Payer: Self-pay | Admitting: Legal Medicine

## 2021-03-18 ENCOUNTER — Other Ambulatory Visit: Payer: Self-pay

## 2021-03-18 ENCOUNTER — Ambulatory Visit: Payer: BC Managed Care – PPO | Admitting: Legal Medicine

## 2021-03-18 VITALS — BP 138/72 | HR 57 | Temp 98.2°F | Resp 18 | Ht 72.0 in | Wt 233.0 lb

## 2021-03-18 DIAGNOSIS — Z6831 Body mass index (BMI) 31.0-31.9, adult: Secondary | ICD-10-CM | POA: Diagnosis not present

## 2021-03-18 DIAGNOSIS — C189 Malignant neoplasm of colon, unspecified: Secondary | ICD-10-CM

## 2021-03-18 DIAGNOSIS — E782 Mixed hyperlipidemia: Secondary | ICD-10-CM | POA: Diagnosis not present

## 2021-03-18 NOTE — Progress Notes (Signed)
Established Patient Office Visit  Subjective:  Patient ID: Noah Clark, male    DOB: [birth year]  Age: 55 y.o. MRN: [id number]  CC:  Chief Complaint  Patient presents with   Hyperlipidemia    HPI Noah Clark presents for chronic vist  Patient presents with hyperlipidemia.  Compliance with treatment has been good; patient takes medicines as directed, maintains low cholesterol diet, follows up as directed, and maintains exercise regimen.  Patient is using diet without problems.   Colon cancer doing well no recurrence.  Past Medical History:  Diagnosis Date   Anxiety    Asthma    as a child   Cancer ([place])    colon cancer   Colon cancer ([place]) 09/13/2017   Heart murmur    Mixed dyslipidemia 02/12/2019    Past Surgical History:  Procedure Laterality Date   LAPAROSCOPIC PARTIAL COLECTOMY N/A 09/13/2017   Procedure: LAPAROSCOPIC SIGMOIDECTOMY COLECTOMY ERAS PATHWAY;  Surgeon: Leighton Ruff, MD;  Location: [facility];  Service: General;  Laterality: N/A;   VASECTOMY     WISDOM TOOTH EXTRACTION      Family History  Problem Relation Age of Onset   Hypertension Mother    Other Father        Car accident   Hypertension Father    Arthritis Father     Social History   Socioeconomic History   Marital status: Married    Spouse name: Not on file   Number of children: 2   Years of education: Not on file   Highest education level: Not on file  Occupational History   Not on file  Tobacco Use   Smoking status: Never   Smokeless tobacco: Never  Vaping Use   Vaping Use: Never used  Substance and Sexual Activity   Alcohol use: Yes    Comment: weekends   Drug use: No   Sexual activity: Yes    Partners: Female  Other Topics Concern   Not on file  Social History Narrative   Not on file   Social Determinants of Health   Financial Resource Strain: Not on file  Food Insecurity: Not on file  Transportation Needs: Not on file  Physical Activity: Not on file  Stress: Not on  file  Social Connections: Not on file  Intimate Partner Violence: Not on file    No outpatient medications prior to visit.   No facility-administered medications prior to visit.    No Known Allergies  ROS Review of Systems  Constitutional:  Negative for chills, fatigue and fever.  HENT:  Negative for congestion, ear pain and sore throat.   Respiratory:  Negative for cough and shortness of breath.   Cardiovascular:  Negative for chest pain.  Gastrointestinal:  Negative for abdominal pain, constipation, diarrhea, nausea and vomiting.  Endocrine: Negative for polydipsia, polyphagia and polyuria.  Genitourinary:  Negative for dysuria and frequency.  Musculoskeletal:  Negative for arthralgias and myalgias.  Neurological:  Negative for dizziness and headaches.  Psychiatric/Behavioral:  Negative for dysphoric mood.        No dysphoria     Objective:    Physical Exam  BP 138/72   Pulse (!) 57   Temp 98.2 F (36.8 C)   Resp 18   Ht 6' (1.829 m)   Wt 233 lb (105.7 kg)   SpO2 98%   BMI 31.60 kg/m  Wt Readings from Last 3 Encounters:  03/18/21 233 lb (105.7 kg)  01/18/21 230 lb (104.3 kg)  09/16/20  219 lb (99.3 kg)     Health Maintenance Due  Topic Date Due   COVID-19 Vaccine (1) Never done   Pneumococcal Vaccine 91-85 Years old (1 - PCV) Never done   HIV Screening  Never done   Hepatitis C Screening  Never done   Zoster Vaccines- Shingrix (1 of 2) Never done   COLONOSCOPY (Pts 45-19yr Insurance coverage will need to be confirmed)  Never done    There are no preventive care reminders to display for this patient.  Lab Results  Component Value Date   TSH 2.210 03/18/2021   Lab Results  Component Value Date   WBC 5.3 03/18/2021   HGB 14.9 03/18/2021   HCT 43.7 03/18/2021   MCV 90 03/18/2021   PLT 280 03/18/2021   Lab Results  Component Value Date   NA 140 03/18/2021   K 5.0 03/18/2021   CO2 20 03/18/2021   GLUCOSE 99 03/18/2021   BUN 14 03/18/2021    CREATININE 0.97 03/18/2021   BILITOT 0.5 03/18/2021   ALKPHOS 65 03/18/2021   AST 16 03/18/2021   ALT 27 03/18/2021   PROT 6.6 03/18/2021   ALBUMIN 4.7 03/18/2021   CALCIUM 9.5 03/18/2021   ANIONGAP 6 09/15/2017   EGFR 93 03/18/2021   Lab Results  Component Value Date   CHOL 196 03/18/2021   Lab Results  Component Value Date   HDL 63 03/18/2021   Lab Results  Component Value Date   LDLCALC 125 (H) 03/18/2021   Lab Results  Component Value Date   TRIG 43 03/18/2021   Lab Results  Component Value Date   CHOLHDL 3.1 03/18/2021   No results found for: HGBA1C    Assessment & Plan:   Problem List Items Addressed This Visit       Digestive   Colon cancer (HCC)patient is cancer free but not yet 5 years.  No recurrence on last exam.      Other   Mixed hyperlipidemia - Primary   Relevant Orders   Comprehensive metabolic panel (Completed)   Lipid panel (Completed)   CBC with Differential/Platelet (Completed)   TSH (Completed) AN INDIVIDUAL CARE PLAN for hyperlipidemia/ cholesterol was established and reinforced today.  The patient's status was assessed using clinical findings on exam, lab and other diagnostic tests. The patient's disease status was assessed based on evidence-based guidelines and found to be fair controlled. MEDICATIONS were reviewed. SELF MANAGEMENT GOALS have been discussed and patient's success at attaining the goal of low cholesterol was assessed. RECOMMENDATION given include regular exercise 3 days a week and low cholesterol/low fat diet. CLINICAL SUMMARY including written plan to identify barriers unique to the patient due to social or economic  reasons was discussed.     BMI 31.0-31.9,adult An individualize plan was formulated for obesity using patient history and physical exam to encourage weight loss.  An evidence based program was formulated.  Patient is to cut portion size with meals and to plan physical exercise 3 days a week at least 20  minutes.  Weight watchers and other programs are helpful.  Planned amount of weight loss 10 lbs.        Follow-up: Return in about 6 months (around 09/18/2021) for fasting.    LReinaldo Meeker MD

## 2021-03-19 LAB — COMPREHENSIVE METABOLIC PANEL
ALT: 27 IU/L (ref 0–44)
AST: 16 IU/L (ref 0–40)
Albumin/Globulin Ratio: 2.5 — ABNORMAL HIGH (ref 1.2–2.2)
Albumin: 4.7 g/dL (ref 3.8–4.9)
Alkaline Phosphatase: 65 IU/L (ref 44–121)
BUN/Creatinine Ratio: 14 (ref 9–20)
BUN: 14 mg/dL (ref 6–24)
Bilirubin Total: 0.5 mg/dL (ref 0.0–1.2)
CO2: 20 mmol/L (ref 20–29)
Calcium: 9.5 mg/dL (ref 8.7–10.2)
Chloride: 104 mmol/L (ref 96–106)
Creatinine, Ser: 0.97 mg/dL (ref 0.76–1.27)
Globulin, Total: 1.9 g/dL (ref 1.5–4.5)
Glucose: 99 mg/dL (ref 65–99)
Potassium: 5 mmol/L (ref 3.5–5.2)
Sodium: 140 mmol/L (ref 134–144)
Total Protein: 6.6 g/dL (ref 6.0–8.5)
eGFR: 93 mL/min/{1.73_m2} (ref >59)

## 2021-03-19 LAB — CBC WITH DIFFERENTIAL/PLATELET
Basophils Absolute: 0.1 10*3/uL (ref 0.0–0.2)
Basos: 1 %
EOS (ABSOLUTE): 0.1 10*3/uL (ref 0.0–0.4)
Eos: 2 %
Hematocrit: 43.7 % (ref 37.5–51.0)
Hemoglobin: 14.9 g/dL (ref 13.0–17.7)
Immature Grans (Abs): 0 10*3/uL (ref 0.0–0.1)
Immature Granulocytes: 0 %
Lymphocytes Absolute: 1.4 10*3/uL (ref 0.7–3.1)
Lymphs: 27 %
MCH: 30.5 pg (ref 26.6–33.0)
MCHC: 34.1 g/dL (ref 31.5–35.7)
MCV: 90 fL (ref 79–97)
Monocytes Absolute: 0.5 10*3/uL (ref 0.1–0.9)
Monocytes: 9 %
Neutrophils Absolute: 3.3 10*3/uL (ref 1.4–7.0)
Neutrophils: 61 %
Platelets: 280 10*3/uL (ref 150–450)
RBC: 4.88 x10E6/uL (ref 4.14–5.80)
RDW: 12.7 % (ref 11.6–15.4)
WBC: 5.3 10*3/uL (ref 3.4–10.8)

## 2021-03-19 LAB — LIPID PANEL
Chol/HDL Ratio: 3.1 ratio (ref 0.0–5.0)
Cholesterol, Total: 196 mg/dL (ref 100–199)
HDL: 63 mg/dL (ref >39)
LDL Chol Calc (NIH): 125 mg/dL — ABNORMAL HIGH (ref 0–99)
Triglycerides: 43 mg/dL (ref 0–149)
VLDL Cholesterol Cal: 8 mg/dL (ref 5–40)

## 2021-03-19 LAB — CARDIOVASCULAR RISK ASSESSMENT

## 2021-03-19 LAB — TSH: TSH: 2.21 u[IU]/mL (ref 0.450–4.500)

## 2021-03-20 NOTE — Progress Notes (Signed)
Kidney and liver tests normal, LDL cholesterol 125 high need to consider statin to lower, remember 80% of cholesterol is produced by liver, only 20% is from what we eat.CBC normal, TSH 2.21 normal lp

## 2021-03-22 NOTE — Progress Notes (Signed)
We worry about albumin/globulin ration when globulin is high and possibe myeloma, his globulin fraction is ok so not pathological lp

## 2021-09-22 NOTE — Progress Notes (Signed)
Subjective:  Patient ID: Noah Clark, male    DOB: 28-Nov-1965  Age: 56 y.o. MRN: [id number]  Chief Complaint  Patient presents with   Hyperlipidemia    HPI  Hyperlipidemia: Patient is not taking any medication. He eats low fat diet. Patient presents with hyperlipidemia.  Compliance with treatment has been good; patient takes medicines as directed, maintains low cholesterol diet, follows up as directed, and maintains exercise regimen.  Patient is using diet without problems.   No current outpatient medications on file prior to visit.   No current facility-administered medications on file prior to visit.   Past Medical History:  Diagnosis Date   Anxiety    Asthma    as a child   Cancer ([place])    colon cancer   Colon cancer ([place]) 09/13/2017   Heart murmur    Mixed dyslipidemia 02/12/2019   Past Surgical History:  Procedure Laterality Date   LAPAROSCOPIC PARTIAL COLECTOMY N/A 09/13/2017   Procedure: LAPAROSCOPIC SIGMOIDECTOMY COLECTOMY ERAS PATHWAY;  Surgeon: Leighton Ruff, MD;  Location: [facility];  Service: General;  Laterality: N/A;   VASECTOMY     WISDOM TOOTH EXTRACTION      Family History  Problem Relation Age of Onset   Hypertension Mother    Other Father        Car accident   Hypertension Father    Arthritis Father    Social History   Socioeconomic History   Marital status: Married    Spouse name: Not on file   Number of children: 2   Years of education: Not on file   Highest education level: Not on file  Occupational History   Not on file  Tobacco Use   Smoking status: Never   Smokeless tobacco: Never  Vaping Use   Vaping Use: Never used  Substance and Sexual Activity   Alcohol use: Yes    Comment: weekends   Drug use: No   Sexual activity: Yes    Partners: Female  Other Topics Concern   Not on file  Social History Narrative   Not on file   Social Determinants of Health   Financial Resource Strain: Not on file  Food Insecurity: Not on file   Transportation Needs: Not on file  Physical Activity: Not on file  Stress: Not on file  Social Connections: Not on file    Review of Systems  Constitutional:  Negative for chills, fatigue, fever and unexpected weight change.  HENT:  Negative for congestion, ear pain, sinus pain and sore throat.   Eyes:  Negative for visual disturbance.  Cardiovascular:  Negative for chest pain and palpitations.  Gastrointestinal:  Negative for abdominal pain, blood in stool, constipation, diarrhea, nausea and vomiting.  Endocrine: Negative for polydipsia.  Genitourinary:  Negative for dysuria.  Musculoskeletal:  Negative for back pain and myalgias.  Skin:  Negative for rash.  Neurological:  Negative for headaches.  Psychiatric/Behavioral:  Negative for dysphoric mood. The patient is not nervous/anxious.     Objective:  BP 120/70    Pulse 60    Temp 98.8 F (37.1 C)    Resp 16    Wt 218 lb (98.9 kg)    BMI 29.57 kg/m   BP/Weight 09/23/2021 03/18/2021 4/58/0998  Systolic BP 338 250 -  Diastolic BP 70 72 -  Wt. (Lbs) 218 233 230  BMI 29.57 31.6 31.19    Physical Exam Vitals reviewed.  Constitutional:      General: He is not in  acute distress.    Appearance: Normal appearance.  HENT:     Right Ear: Tympanic membrane normal.     Left Ear: Tympanic membrane normal.     Mouth/Throat:     Mouth: Mucous membranes are moist.     Pharynx: Oropharynx is clear.  Eyes:     Conjunctiva/sclera: Conjunctivae normal.     Pupils: Pupils are equal, round, and reactive to light.  Cardiovascular:     Rate and Rhythm: Normal rate and regular rhythm.     Pulses: Normal pulses.     Heart sounds: Normal heart sounds. No murmur heard.   No gallop.  Pulmonary:     Effort: Pulmonary effort is normal. No respiratory distress.     Breath sounds: No wheezing.  Abdominal:     General: Abdomen is flat. Bowel sounds are normal. There is no distension.     Tenderness: There is no abdominal tenderness.   Musculoskeletal:        General: Normal range of motion.     Cervical back: Normal range of motion and neck supple.     Right lower leg: No edema.     Left lower leg: No edema.  Skin:    General: Skin is warm.     Capillary Refill: Capillary refill takes less than 2 seconds.  Neurological:     General: No focal deficit present.     Mental Status: He is alert. Mental status is at baseline.  Psychiatric:        Mood and Affect: Mood normal.        Thought Content: Thought content normal.        Lab Results  Component Value Date   WBC 5.3 03/18/2021   HGB 14.9 03/18/2021   HCT 43.7 03/18/2021   PLT 280 03/18/2021   GLUCOSE 92 09/23/2021   CHOL 250 (H) 09/23/2021   TRIG 63 09/23/2021   HDL 58 09/23/2021   LDLCALC 182 (H) 09/23/2021   ALT 36 09/23/2021   AST 36 09/23/2021   NA 141 09/23/2021   K 4.7 09/23/2021   CL 103 09/23/2021   CREATININE 1.03 09/23/2021   BUN 21 09/23/2021   CO2 21 09/23/2021   TSH 2.210 03/18/2021      Assessment & Plan:   Problem List Items Addressed This Visit       Digestive   Colon cancer ([place]) No active treatment     Other   Mixed hyperlipidemia - Primary   Relevant Orders   Comprehensive metabolic panel (Completed)   Lipid panel (Completed)   NMR, lipoprofile AN INDIVIDUAL CARE PLAN for hyperlipidemia/ cholesterol was established and reinforced today.  The patient's status was assessed using clinical findings on exam, lab and other diagnostic tests. The patient's disease status was assessed based on evidence-based guidelines and found to be fair controlled. MEDICATIONS were reviewed. SELF MANAGEMENT GOALS have been discussed and patient's success at attaining the goal of low cholesterol was assessed. RECOMMENDATION given include regular exercise 3 days a week and low cholesterol/low fat diet. CLINICAL SUMMARY including written plan to identify barriers unique to the patient due to social or economic  reasons was discussed.     BMI 29.0-29.9,adult An individualize plan was formulated for obesity using patient history and physical exam to encourage weight loss.  An evidence based program was formulated.  Patient is to cut portion size with meals and to plan physical exercise 3 days a week at least 20 minutes.  Weight watchers and  other programs are helpful.  Planned amount of weight loss 10 lbs.   .    Orders Placed This Encounter  Procedures   Comprehensive metabolic panel   Lipid panel   NMR, lipoprofile   Cardiovascular Risk Assessment     Follow-up: Return in about 6 months (around 03/23/2022) for fasting.  An After Visit Summary was printed and given to the patient.  Reinaldo Meeker, MD [facility] [phone]

## 2021-09-23 ENCOUNTER — Encounter: Payer: Self-pay | Admitting: Legal Medicine

## 2021-09-23 ENCOUNTER — Other Ambulatory Visit: Payer: Self-pay

## 2021-09-23 ENCOUNTER — Ambulatory Visit: Payer: BC Managed Care – PPO | Admitting: Legal Medicine

## 2021-09-23 VITALS — BP 120/70 | HR 60 | Temp 98.8°F | Resp 16 | Wt 218.0 lb

## 2021-09-23 DIAGNOSIS — E782 Mixed hyperlipidemia: Secondary | ICD-10-CM | POA: Diagnosis not present

## 2021-09-23 DIAGNOSIS — Z6829 Body mass index (BMI) 29.0-29.9, adult: Secondary | ICD-10-CM

## 2021-09-23 DIAGNOSIS — C189 Malignant neoplasm of colon, unspecified: Secondary | ICD-10-CM | POA: Diagnosis not present

## 2021-09-23 HISTORY — DX: Body mass index (BMI) 29.0-29.9, adult: Z68.29

## 2021-09-25 LAB — COMPREHENSIVE METABOLIC PANEL
ALT: 36 IU/L (ref 0–44)
AST: 36 IU/L (ref 0–40)
Albumin/Globulin Ratio: 2.7 — ABNORMAL HIGH (ref 1.2–2.2)
Albumin: 4.9 g/dL (ref 3.8–4.9)
Alkaline Phosphatase: 73 IU/L (ref 44–121)
BUN/Creatinine Ratio: 20 (ref 9–20)
BUN: 21 mg/dL (ref 6–24)
Bilirubin Total: 0.7 mg/dL (ref 0.0–1.2)
CO2: 21 mmol/L (ref 20–29)
Calcium: 9.9 mg/dL (ref 8.7–10.2)
Chloride: 103 mmol/L (ref 96–106)
Creatinine, Ser: 1.03 mg/dL (ref 0.76–1.27)
Globulin, Total: 1.8 g/dL (ref 1.5–4.5)
Glucose: 92 mg/dL (ref 70–99)
Potassium: 4.7 mmol/L (ref 3.5–5.2)
Sodium: 141 mmol/L (ref 134–144)
Total Protein: 6.7 g/dL (ref 6.0–8.5)
eGFR: 86 mL/min/{1.73_m2} (ref >59)

## 2021-09-25 LAB — LIPID PANEL
Chol/HDL Ratio: 4.3 ratio (ref 0.0–5.0)
Cholesterol, Total: 250 mg/dL — ABNORMAL HIGH (ref 100–199)
HDL: 58 mg/dL (ref >39)
LDL Chol Calc (NIH): 182 mg/dL — ABNORMAL HIGH (ref 0–99)
Triglycerides: 63 mg/dL (ref 0–149)
VLDL Cholesterol Cal: 10 mg/dL (ref 5–40)

## 2021-09-25 LAB — CARDIOVASCULAR RISK ASSESSMENT

## 2021-09-25 LAB — NMR, LIPOPROFILE
Cholesterol, Total: 255 mg/dL — ABNORMAL HIGH (ref 100–199)
HDL Particle Number: 29.3 umol/L — ABNORMAL LOW (ref >=30.5)
HDL-C: 63 mg/dL (ref >39)
LDL Particle Number: 2115 nmol/L — ABNORMAL HIGH (ref <1000)
LDL Size: 21.6 nm (ref >20.5)
LDL-C (NIH Calc): 182 mg/dL — ABNORMAL HIGH (ref 0–99)
LP-IR Score: <25 (ref <=45)
Small LDL Particle Number: 519 nmol/L (ref <=527)
Triglycerides: 62 mg/dL (ref 0–149)

## 2021-09-25 NOTE — Progress Notes (Signed)
Kidney and liver tests normal, LDL cholesterol 182 very high strongly recommend statin, unable to control with diet due to high level,  lp

## 2021-09-26 NOTE — Progress Notes (Signed)
Call in crestor 20mg  qd,  lp

## 2021-09-26 NOTE — Progress Notes (Signed)
Low HDL, LDL particles ate 50 large and 50 small, should be treated lp

## 2021-09-27 ENCOUNTER — Other Ambulatory Visit: Payer: Self-pay | Admitting: Legal Medicine

## 2021-09-27 DIAGNOSIS — E782 Mixed hyperlipidemia: Secondary | ICD-10-CM

## 2021-09-27 MED ORDER — ROSUVASTATIN CALCIUM 20 MG PO TABS: 20.0000 mg | ORAL_TABLET | Freq: Every day | ORAL | 3 refills | Status: DC

## 2022-01-13 LAB — HM COLONOSCOPY

## 2022-01-25 ENCOUNTER — Encounter: Payer: Self-pay | Admitting: Legal Medicine

## 2022-04-18 ENCOUNTER — Telehealth: Payer: Self-pay

## 2022-04-18 NOTE — Telephone Encounter (Signed)
Noah Clark called with positive home covid test last night.  No shortness of breath or difficulty breathing.  He is complaining of fever and sorethroat.  MyChart visit scheduled for tomorrow morning.

## 2022-04-19 ENCOUNTER — Telehealth (INDEPENDENT_AMBULATORY_CARE_PROVIDER_SITE_OTHER): Payer: BC Managed Care – PPO | Admitting: Physician Assistant

## 2022-04-19 ENCOUNTER — Encounter: Payer: Self-pay | Admitting: Physician Assistant

## 2022-04-19 ENCOUNTER — Other Ambulatory Visit: Payer: Self-pay | Admitting: Physician Assistant

## 2022-04-19 VITALS — Temp 98.1°F | Ht 71.0 in | Wt 220.0 lb

## 2022-04-19 DIAGNOSIS — J029 Acute pharyngitis, unspecified: Secondary | ICD-10-CM | POA: Diagnosis not present

## 2022-04-19 DIAGNOSIS — U071 COVID-19: Secondary | ICD-10-CM | POA: Diagnosis not present

## 2022-04-19 MED ORDER — AZITHROMYCIN 250 MG PO TABS: ORAL_TABLET | ORAL | 0 refills | Status: DC

## 2022-04-19 MED ORDER — AZITHROMYCIN 250 MG PO TABS: ORAL_TABLET | ORAL | 0 refills | Status: AC

## 2022-04-19 NOTE — Progress Notes (Signed)
Virtual Visit via Telephone Note   This visit type was conducted due to national recommendations for restrictions regarding the COVID-19 Pandemic (e.g. social distancing) in an effort to limit this patient's exposure and mitigate transmission in our community.  Due to his co-morbid illnesses, this patient is at least at moderate risk for complications without adequate follow up.  This format is felt to be most appropriate for this patient at this time.  The patient did not have access to video technology/had technical difficulties with video requiring transitioning to audio format only (telephone).  All issues noted in this document were discussed and addressed.  No physical exam could be performed with this format.  Patient verbally consented to a telehealth visit.   Date:  04/19/2022   ID:  Noah Clark, DOB 1965/12/01, MRN [id number]  Patient Location: Home Provider Location: Office  PCP:  Lillard Anes, MD     Chief Complaint:  sore throat - positive COVID  History of Present Illness:    Noah Clark is a 56 y.o. male with complaints of sore throat, fever and congestion that started on Monday.  He did take home COVID test which was positive.  He denies chest pain or dyspnea.  No cough or wheezing He is concerned more with throat and states he had strep last time  The patient does have symptoms concerning for COVID-19 infection (fever, chills, cough, or new shortness of breath).    Past Medical History:  Diagnosis Date   Anxiety    Asthma    as a child   Cancer ([place])    colon cancer   Colon cancer ([place]) 09/13/2017   Heart murmur    Mixed dyslipidemia 02/12/2019   Past Surgical History:  Procedure Laterality Date   LAPAROSCOPIC PARTIAL COLECTOMY N/A 09/13/2017   Procedure: LAPAROSCOPIC SIGMOIDECTOMY COLECTOMY ERAS PATHWAY;  Surgeon: Leighton Ruff, MD;  Location: [facility];  Service: General;  Laterality: N/A;   VASECTOMY     WISDOM TOOTH EXTRACTION       Current  Meds  Medication Sig   rosuvastatin (CRESTOR) 20 MG tablet Take 1 tablet (20 mg total) by mouth daily.     Allergies:   Patient has no known allergies.   Social History   Tobacco Use   Smoking status: Never   Smokeless tobacco: Never  Vaping Use   Vaping Use: Never used  Substance Use Topics   Alcohol use: Yes    Comment: weekends   Drug use: No     Family Hx: The patient's family history includes Arthritis in his father; Hypertension in his father and mother; Other in his father.  ROS:   Please see the history of present illness.    All other systems reviewed and are negative.  Labs/Other Tests and Data Reviewed:    Recent Labs: 09/23/2021: ALT 36; BUN 21; Creatinine, Ser 1.03; Potassium 4.7; Sodium 141   Recent Lipid Panel Lab Results  Component Value Date/Time   CHOL 250 (H) 09/23/2021 08:20 AM   TRIG 63 09/23/2021 08:20 AM   HDL 58 09/23/2021 08:20 AM   CHOLHDL 4.3 09/23/2021 08:20 AM   LDLCALC 182 (H) 09/23/2021 08:20 AM    Wt Readings from Last 3 Encounters:  04/19/22 220 lb (99.8 kg)  09/23/21 218 lb (98.9 kg)  03/18/21 233 lb (105.7 kg)     Objective:    Vital Signs:  Temp 98.1 F (36.7 C)   Ht '5\' 11"'$  (1.803 m)   Wt  220 lb (99.8 kg)   BMI 30.68 kg/m    VITAL SIGNS:  reviewed GEN:  no acute distress  ASSESSMENT & PLAN:    COVID - recommend rest, fluids, tylenol and quarantine according to guidelines- pt defers treatment with molnupiravir Pharyngitis - will send in rx for zpack as directed - follow up if symptoms persist/worsen  COVID-19 Education: The signs and symptoms of COVID-19 were discussed with the patient and how to seek care for testing (follow up with PCP or arrange E-visit). The importance of social distancing was discussed today.  Time:   Today, I have spent 10 minutes with the patient with telehealth technology discussing the above problems.     Medication Adjustments/Labs and Tests Ordered: Current medicines are reviewed at  length with the patient today.  Concerns regarding medicines are outlined above.   Tests Ordered: No orders of the defined types were placed in this encounter.   Medication Changes: No orders of the defined types were placed in this encounter.   Follow Up:  In Person prn  Signed, Webb Silversmith, PA-C  04/19/2022 9:02 AM    [place]

## 2022-10-27 ENCOUNTER — Encounter: Payer: Self-pay | Admitting: Nurse Practitioner

## 2022-10-27 ENCOUNTER — Ambulatory Visit: Payer: BC Managed Care – PPO | Admitting: Nurse Practitioner

## 2022-10-27 VITALS — BP 144/72 | HR 60 | Temp 97.3°F | Resp 18 | Ht 71.0 in | Wt 230.6 lb

## 2022-10-27 DIAGNOSIS — E782 Mixed hyperlipidemia: Secondary | ICD-10-CM | POA: Diagnosis not present

## 2022-10-27 DIAGNOSIS — Z0000 Encounter for general adult medical examination without abnormal findings: Secondary | ICD-10-CM

## 2022-10-27 DIAGNOSIS — F411 Generalized anxiety disorder: Secondary | ICD-10-CM | POA: Insufficient documentation

## 2022-10-27 DIAGNOSIS — R030 Elevated blood-pressure reading, without diagnosis of hypertension: Secondary | ICD-10-CM

## 2022-10-27 DIAGNOSIS — H6123 Impacted cerumen, bilateral: Secondary | ICD-10-CM | POA: Insufficient documentation

## 2022-10-27 HISTORY — DX: Generalized anxiety disorder: F41.1

## 2022-10-27 HISTORY — DX: Encounter for general adult medical examination without abnormal findings: Z00.00

## 2022-10-27 HISTORY — DX: Impacted cerumen, bilateral: H61.23

## 2022-10-27 HISTORY — DX: Elevated blood-pressure reading, without diagnosis of hypertension: R03.0

## 2022-10-27 NOTE — Patient Instructions (Addendum)
Over the counter Debrox for wax Please take your blood pressure for 2 weeks and keep a record and come back in 2 weeks for follow up if it is constantly more than 130/80  Blood Pressure Record Sheet To take your blood pressure, you will need a blood pressure machine. You may be prescribed one, or you can buy a blood pressure machine (blood pressure monitor) at your clinic, drug store, or online. When choosing one, look for these features: An automatic monitor that has an arm cuff. A cuff that wraps snugly, but not too tightly, around your upper arm. You should be able to fit only one finger between your arm and the cuff. A device that stores blood pressure reading results. Do not choose a monitor that measures your blood pressure from your wrist or finger. Follow your health care provider's instructions for how to take your blood pressure. To use this form: Get one reading in the morning (a.m.) before you take any medicines. Get one reading in the evening (p.m.) before supper. Take at least two readings with each blood pressure check. This makes sure the results are correct. Wait 1-2 minutes between measurements. Write down the results in the spaces on this form. Repeat this once a week, or as told by your health care provider. Make a follow-up appointment with your health care provider to discuss the results. Blood pressure log Date: _______________________ a.m. _____________________(1st reading) _____________________(2nd reading) p.m. _____________________(1st reading) _____________________(2nd reading) Date: _______________________ a.m. _____________________(1st reading) _____________________(2nd reading) p.m. _____________________(1st reading) _____________________(2nd reading) Date: _______________________ a.m. _____________________(1st reading) _____________________(2nd reading) p.m. _____________________(1st reading) _____________________(2nd reading) Date:  _______________________ a.m. _____________________(1st reading) _____________________(2nd reading) p.m. _____________________(1st reading) _____________________(2nd reading) Date: _______________________ a.m. _____________________(1st reading) _____________________(2nd reading) p.m. _____________________(1st reading) _____________________(2nd reading) This information is not intended to replace advice given to you by your health care provider. Make sure you discuss any questions you have with your health care provider. Document Revised: 05/05/2021 Document Reviewed: 05/05/2021 Elsevier Patient Education  [employer].

## 2022-10-27 NOTE — Assessment & Plan Note (Signed)
Debrox OTC suggested

## 2022-10-27 NOTE — Progress Notes (Signed)
Subjective:  Patient ID: Noah Clark, male    DOB: [birth year]  Age: 57 y.o. MRN: [id number]  Chief Complaints: COMPLETE PHYSICAL EXAM  History of Present illness:  Well Adult Physical: Patient here for a comprehensive physical exam.The patient reports problems - anxiety Do you take any herbs or supplements that were not prescribed by a doctor? no Are you taking calcium supplements? no Are you taking aspirin daily? no  Encounter for general adult medical examination without abnormal findings  Physical ("At Risk" items are starred): Patient's last physical exam was 1 year ago .  Patient wears a seat belt, has smoke detectors, has carbon monoxide detectors, practices appropriate gun safety, and wears sunscreen with extended sun exposure. Dental Care: biannual cleanings, brushes and flosses daily. Ophthalmology/Optometry: Annual visit.  Hearing loss: none Vision impairments: none        Social History   Socioeconomic History   Marital status: Married    Spouse name: Not on file   Number of children: 2   Years of education: Not on file   Highest education level: Not on file  Occupational History   Not on file  Tobacco Use   Smoking status: Never   Smokeless tobacco: Never  Vaping Use   Vaping Use: Never used  Substance and Sexual Activity   Alcohol use: Yes    Comment: weekends   Drug use: No   Sexual activity: Yes    Partners: Female  Other Topics Concern   Not on file  Social History Narrative   Not on file   Social Determinants of Health   Financial Resource Strain: Not on file  Food Insecurity: Not on file  Transportation Needs: Not on file  Physical Activity: Not on file  Stress: Not on file  Social Connections: Not on file   Past Medical History:  Diagnosis Date   Anxiety    Asthma    as a child   Cancer ([place])    colon cancer   Colon cancer ([place]) 09/13/2017   Heart murmur    Mixed dyslipidemia 02/12/2019   Past Surgical History:  Procedure  Laterality Date   LAPAROSCOPIC PARTIAL COLECTOMY N/A 09/13/2017   Procedure: LAPAROSCOPIC SIGMOIDECTOMY COLECTOMY ERAS PATHWAY;  Surgeon: Leighton Ruff, MD;  Location: [facility];  Service: General;  Laterality: N/A;   VASECTOMY     WISDOM TOOTH EXTRACTION      Family History  Problem Relation Age of Onset   Hypertension Mother    Other Father        Car accident   Hypertension Father    Arthritis Father    Social History   Socioeconomic History   Marital status: Married    Spouse name: Not on file   Number of children: 2   Years of education: Not on file   Highest education level: Not on file  Occupational History   Not on file  Tobacco Use   Smoking status: Never   Smokeless tobacco: Never  Vaping Use   Vaping Use: Never used  Substance and Sexual Activity   Alcohol use: Yes    Comment: weekends   Drug use: No   Sexual activity: Yes    Partners: Female  Other Topics Concern   Not on file  Social History Narrative   Not on file   Social Determinants of Health   Financial Resource Strain: Not on file  Food Insecurity: Not on file  Transportation Needs: Not on file  Physical Activity: Not on  file  Stress: Not on file  Social Connections: Not on file   Review of Systems  Constitutional:  Negative for chills, diaphoresis, fatigue and fever.  HENT:  Negative for congestion, ear pain and sore throat.   Respiratory:  Negative for cough and shortness of breath.   Cardiovascular:  Negative for chest pain and palpitations.  Gastrointestinal:  Negative for abdominal pain, constipation, diarrhea, nausea and vomiting.  Endocrine: Negative for polydipsia, polyphagia and polyuria.  Genitourinary:  Negative for dysuria and frequency.  Musculoskeletal:  Negative for arthralgias, back pain and myalgias.  Neurological:  Negative for dizziness and headaches.  Psychiatric/Behavioral:  Negative for dysphoric mood. The patient is not nervous/anxious.       10/27/2022    9:09 AM   GAD 7 : Generalized Anxiety Score  Nervous, Anxious, on Edge 2  Control/stop worrying 2  Worry too much - different things 2  Trouble relaxing 0  Restless 0  Easily annoyed or irritable 1  Afraid - awful might happen 1  Total GAD 7 Score 8  Anxiety Difficulty Somewhat difficult      10/27/2022    9:11 AM 03/18/2021    7:36 AM 09/16/2020    2:08 PM  Depression screen PHQ 2/9  Decreased Interest 0 0 0  Down, Depressed, Hopeless 0 0 0  PHQ - 2 Score 0 0 0  Altered sleeping 2    Tired, decreased energy 3    Change in appetite 2    Feeling bad or failure about yourself  0    Trouble concentrating 1    Moving slowly or fidgety/restless 0    Suicidal thoughts 0    PHQ-9 Score 8    Difficult doing work/chores Somewhat difficult          Objective:  BP (!) 144/72 (BP Location: Right Arm)   Pulse 60   Temp (!) 97.3 F (36.3 C)   Resp 18   Ht '5\' 11"'$  (1.803 m)   Wt 230 lb 9.6 oz (104.6 kg)   SpO2 100%   BMI 32.16 kg/m      10/27/2022    9:53 AM 10/27/2022    9:04 AM 04/19/2022    8:33 AM  BP/Weight  Systolic BP 123456 [id number]   Diastolic BP 72 60   Wt. (Lbs)  230.6 220  BMI  32.16 kg/m2 30.68 kg/m2    Physical Exam Vitals reviewed.  Constitutional:      Appearance: Normal appearance.  HENT:     Right Ear: Tympanic membrane normal.     Left Ear: Tympanic membrane normal.     Nose: Nose normal.     Mouth/Throat:     Pharynx: No oropharyngeal exudate or posterior oropharyngeal erythema.  Eyes:     Conjunctiva/sclera: Conjunctivae normal.  Neck:     Vascular: No carotid bruit.  Cardiovascular:     Rate and Rhythm: Normal rate and regular rhythm.     Pulses: Normal pulses.     Heart sounds: Normal heart sounds.  Pulmonary:     Effort: Pulmonary effort is normal.     Breath sounds: Normal breath sounds.  Abdominal:     General: Bowel sounds are normal.     Palpations: There is no mass.     Tenderness: There is no abdominal tenderness.  Musculoskeletal:      Cervical back: Normal range of motion.  Skin:    Findings: No lesion.  Neurological:     Mental Status: He is alert and  oriented to person, place, and time.  Psychiatric:        Mood and Affect: Mood normal.        Behavior: Behavior normal.     Lab Results  Component Value Date   WBC 5.3 03/18/2021   HGB 14.9 03/18/2021   HCT 43.7 03/18/2021   PLT 280 03/18/2021   GLUCOSE 92 09/23/2021   CHOL 250 (H) 09/23/2021   TRIG 63 09/23/2021   HDL 58 09/23/2021   LDLCALC 182 (H) 09/23/2021   ALT 36 09/23/2021   AST 36 09/23/2021   NA 141 09/23/2021   K 4.7 09/23/2021   CL 103 09/23/2021   CREATININE 1.03 09/23/2021   BUN 21 09/23/2021   CO2 21 09/23/2021   TSH 2.210 03/18/2021      Assessment & Plan:   Encounter for physical examination Assessment & Plan: Preventative measures discussed and routine lab work will be done today  Orders: -     CBC with Differential/Platelet -     Comprehensive metabolic panel -     Lipid panel -     Hemoglobin A1c -     TSH  Mixed hyperlipidemia Assessment & Plan: Patient's last LDL ( 183 in 2023), will be rechecked today Patient refused to be on any medicine  Will discuss about this in coming follow up in 2 weeks if LDL panel still abnormal  Nutrition: Stressed importance of moderation in sodium intake, saturated fat and cholesterol, caloric balance, sufficient intake of complex carbohydrates, fiber, calcium and iron.   Exercise: Stressed the importance of regular exercise.     Orders: -     CBC with Differential/Platelet -     Comprehensive metabolic panel -     Lipid panel -     Hemoglobin A1c -     TSH  Elevated blood pressure reading without diagnosis of hypertension Assessment & Plan: Patient's two BP reading in the clinic systolic in [id number]  Asked the patient to have a log of BP for 2 weeks and come back in 2 weeks  Patient refused to be on any medicines now for HYPERTENSION Will discuss in 2 weeks after evaluating the  log book  Nutrition: Stressed importance of moderation in sodium intake, saturated fat and cholesterol, caloric balance, sufficient intake of complex carbohydrates, fiber, calcium and iron.   Exercise: Stressed the importance of regular exercise.      Bilateral impacted cerumen Assessment & Plan: Debrox OTC suggested   GAD (generalized anxiety disorder) Assessment & Plan: PHQ 8, GAD 8  Patient refused to be on any SSRI and SNRI, he is asking for something AS NEEDED Patient refused to be anxious and depressed all the time , he said it is once in a while due to his job, refused any suicidal ideations, will keep monitoring this.     This is a list of the screening recommended for you and due dates:  Health Maintenance  Topic Date Due   COVID-19 Vaccine (1) Never done   HIV Screening  Never done   Hepatitis C Screening: USPSTF Recommendation to screen - Ages 10-79 yo.  Never done   DTaP/Tdap/Td vaccine (1 - Tdap) Never done   Zoster (Shingles) Vaccine (1 of 2) Never done   Flu Shot  Never done   Colon Cancer Screening  01/13/2025   HPV Vaccine  Aged Out      Follow-up: Return in about 2 weeks (around 11/10/2022).   An After Visit Summary was printed and  given to the patient. I, Neil Crouch have reviewed all documentation for this visit. The documentation on 10/27/22   for the exam, diagnosis, procedures, and orders are all accurate and complete.    Neil Crouch, DNP, [place] [facility] [phone]

## 2022-10-27 NOTE — Assessment & Plan Note (Signed)
Patient's last LDL ( 183 in 2023), will be rechecked today Patient refused to be on any medicine  Will discuss about this in coming follow up in 2 weeks if LDL panel still abnormal  Nutrition: Stressed importance of moderation in sodium intake, saturated fat and cholesterol, caloric balance, sufficient intake of complex carbohydrates, fiber, calcium and iron.   Exercise: Stressed the importance of regular exercise.

## 2022-10-27 NOTE — Assessment & Plan Note (Addendum)
PHQ 8, GAD 8  Patient refused to be on any SSRI and SNRI, he is asking for something AS NEEDED Patient refused to be anxious and depressed all the time , he said it is once in a while due to his job, refused any suicidal ideations, will keep monitoring this.

## 2022-10-27 NOTE — Assessment & Plan Note (Signed)
Patient's two BP reading in the clinic systolic in [id number]  Asked the patient to have a log of BP for 2 weeks and come back in 2 weeks  Patient refused to be on any medicines now for HYPERTENSION Will discuss in 2 weeks after evaluating the log book  Nutrition: Stressed importance of moderation in sodium intake, saturated fat and cholesterol, caloric balance, sufficient intake of complex carbohydrates, fiber, calcium and iron.   Exercise: Stressed the importance of regular exercise.

## 2022-10-27 NOTE — Assessment & Plan Note (Signed)
Preventative measures discussed and routine lab work will be done today

## 2022-10-28 LAB — COMPREHENSIVE METABOLIC PANEL
ALT: 69 IU/L — ABNORMAL HIGH (ref 0–44)
AST: 103 IU/L — ABNORMAL HIGH (ref 0–40)
Albumin/Globulin Ratio: 2.2 (ref 1.2–2.2)
Albumin: 4.4 g/dL (ref 3.8–4.9)
Alkaline Phosphatase: 67 IU/L (ref 44–121)
BUN/Creatinine Ratio: 19 (ref 9–20)
BUN: 19 mg/dL (ref 6–24)
Bilirubin Total: 0.4 mg/dL (ref 0.0–1.2)
CO2: 22 mmol/L (ref 20–29)
Calcium: 9.4 mg/dL (ref 8.7–10.2)
Chloride: 104 mmol/L (ref 96–106)
Creatinine, Ser: 0.99 mg/dL (ref 0.76–1.27)
Globulin, Total: 2 g/dL (ref 1.5–4.5)
Glucose: 104 mg/dL — ABNORMAL HIGH (ref 70–99)
Potassium: 4.8 mmol/L (ref 3.5–5.2)
Sodium: 140 mmol/L (ref 134–144)
Total Protein: 6.4 g/dL (ref 6.0–8.5)
eGFR: 89 mL/min/{1.73_m2} (ref >59)

## 2022-10-28 LAB — TSH: TSH: 1.43 u[IU]/mL (ref 0.450–4.500)

## 2022-10-28 LAB — CBC WITH DIFFERENTIAL/PLATELET
Basophils Absolute: 0.1 10*3/uL (ref 0.0–0.2)
Basos: 1 %
EOS (ABSOLUTE): 0.1 10*3/uL (ref 0.0–0.4)
Eos: 1 %
Hematocrit: 43.6 % (ref 37.5–51.0)
Hemoglobin: 14.6 g/dL (ref 13.0–17.7)
Immature Grans (Abs): 0 10*3/uL (ref 0.0–0.1)
Immature Granulocytes: 0 %
Lymphocytes Absolute: 1.3 10*3/uL (ref 0.7–3.1)
Lymphs: 29 %
MCH: 29.9 pg (ref 26.6–33.0)
MCHC: 33.5 g/dL (ref 31.5–35.7)
MCV: 89 fL (ref 79–97)
Monocytes Absolute: 0.5 10*3/uL (ref 0.1–0.9)
Monocytes: 11 %
Neutrophils Absolute: 2.6 10*3/uL (ref 1.4–7.0)
Neutrophils: 58 %
Platelets: 268 10*3/uL (ref 150–450)
RBC: 4.88 x10E6/uL (ref 4.14–5.80)
RDW: 12.3 % (ref 11.6–15.4)
WBC: 4.5 10*3/uL (ref 3.4–10.8)

## 2022-10-28 LAB — HEMOGLOBIN A1C
Est. average glucose Bld gHb Est-mCnc: 111 mg/dL
Hgb A1c MFr Bld: 5.5 % (ref 4.8–5.6)

## 2022-10-28 LAB — LIPID PANEL
Chol/HDL Ratio: 3 ratio (ref 0.0–5.0)
Cholesterol, Total: 164 mg/dL (ref 100–199)
HDL: 55 mg/dL (ref >39)
LDL Chol Calc (NIH): 99 mg/dL (ref 0–99)
Triglycerides: 48 mg/dL (ref 0–149)
VLDL Cholesterol Cal: 10 mg/dL (ref 5–40)

## 2022-10-28 LAB — CARDIOVASCULAR RISK ASSESSMENT

## 2022-10-30 ENCOUNTER — Other Ambulatory Visit: Payer: Self-pay

## 2022-10-30 DIAGNOSIS — R748 Abnormal levels of other serum enzymes: Secondary | ICD-10-CM

## 2022-11-10 ENCOUNTER — Other Ambulatory Visit: Payer: BC Managed Care – PPO

## 2022-11-10 DIAGNOSIS — R748 Abnormal levels of other serum enzymes: Secondary | ICD-10-CM

## 2022-11-10 LAB — COMPREHENSIVE METABOLIC PANEL
ALT: 27 IU/L (ref 0–44)
AST: 19 IU/L (ref 0–40)
Albumin/Globulin Ratio: 2 (ref 1.2–2.2)
Albumin: 4.5 g/dL (ref 3.8–4.9)
Alkaline Phosphatase: 68 IU/L (ref 44–121)
BUN/Creatinine Ratio: 16 (ref 9–20)
BUN: 17 mg/dL (ref 6–24)
Bilirubin Total: 0.4 mg/dL (ref 0.0–1.2)
CO2: 21 mmol/L (ref 20–29)
Calcium: 9.7 mg/dL (ref 8.7–10.2)
Chloride: 104 mmol/L (ref 96–106)
Creatinine, Ser: 1.06 mg/dL (ref 0.76–1.27)
Globulin, Total: 2.2 g/dL (ref 1.5–4.5)
Glucose: 99 mg/dL (ref 70–99)
Potassium: 4.7 mmol/L (ref 3.5–5.2)
Sodium: 141 mmol/L (ref 134–144)
Total Protein: 6.7 g/dL (ref 6.0–8.5)
eGFR: 82 mL/min/{1.73_m2} (ref >59)

## 2023-04-27 ENCOUNTER — Ambulatory Visit: Payer: BC Managed Care – PPO | Admitting: Physician Assistant

## 2023-04-27 VITALS — BP 138/82 | HR 53 | Temp 97.7°F | Ht 71.0 in | Wt 220.2 lb

## 2023-04-27 DIAGNOSIS — E782 Mixed hyperlipidemia: Secondary | ICD-10-CM

## 2023-04-27 DIAGNOSIS — F411 Generalized anxiety disorder: Secondary | ICD-10-CM | POA: Diagnosis not present

## 2023-04-27 DIAGNOSIS — Z1159 Encounter for screening for other viral diseases: Secondary | ICD-10-CM | POA: Diagnosis not present

## 2023-04-27 DIAGNOSIS — Z23 Encounter for immunization: Secondary | ICD-10-CM

## 2023-04-27 NOTE — Progress Notes (Unsigned)
Subjective:  Patient ID: Noah Clark, male    DOB: 22-Jul-1966  Age: 57 y.o. MRN: [id number]  Chief Complaint  Patient presents with   Medical Management of Chronic Issues    HPI  Hyperlipidemia: Patient is not taking any medication. He eats low fat diet.  Had questions about decreasing weight due to him hitting a wall of 213. States that he has been working on his diet and will see a fluctuation of about 3-5 pounds. Is unable to get below 213. Discussed with him that it could be genetics or the fact that he has discontinued going to the gym due to the weather being so hot and he stating he was always very tired after work. Is going to start going to the gym about 4-5 days a week and will discuss again if it has helped with his weight at his next appointment Had questions about tinnitus in his right ear with a history of listening to loud music and working around loud machinery for a few years. States that he only really notices it when it is very quiet or if he sits for awhile. Doesn't bother him a lot or affect his sleep or daily functioning. States that it has gone on for about 10 years, but started after he had a severe sinus and double ear infection. Discussed seeing an audiologist first to make sure there is nothing structurally wrong before going to ENT.   GAD: No taking medicine.     04/27/2023    7:56 AM 10/27/2022    9:11 AM 03/18/2021    7:36 AM 09/16/2020    2:08 PM  Depression screen PHQ 2/9  Decreased Interest 0 0 0 0  Down, Depressed, Hopeless 0 0 0 0  PHQ - 2 Score 0 0 0 0  Altered sleeping 1 2    Tired, decreased energy 1 3    Change in appetite 1 2    Feeling bad or failure about yourself  0 0    Trouble concentrating 1 1    Moving slowly or fidgety/restless 0 0    Suicidal thoughts 0 0    PHQ-9 Score 4 8    Difficult doing work/chores Not difficult at all Somewhat difficult          04/27/2023    7:55 AM  Fall Risk   Falls in the past year? 0  Number falls in  past yr: 0  Injury with Fall? 0  Risk for fall due to : No Fall Risks  Follow up Falls evaluation completed    Patient Care Team: Blane Ohara, MD as PCP - General (Family Medicine)   Review of Systems  Constitutional:  Negative for chills, fatigue and fever.  HENT:  Negative for congestion, ear pain and sore throat.   Respiratory:  Negative for cough and shortness of breath.   Cardiovascular:  Negative for chest pain and palpitations.  Gastrointestinal:  Negative for abdominal pain, constipation, diarrhea, nausea and vomiting.  Genitourinary:  Negative for difficulty urinating and dysuria.  Musculoskeletal:  Negative for arthralgias, back pain and myalgias.  Skin:  Negative for rash.  Neurological:  Negative for dizziness and headaches.  Psychiatric/Behavioral:  Negative for dysphoric mood.     No current outpatient medications on file prior to visit.   No current facility-administered medications on file prior to visit.   Past Medical History:  Diagnosis Date   Anxiety    Asthma    as a child  Cancer [facility])    colon cancer   Colon cancer (HCC) 09/13/2017   Heart murmur    Mixed dyslipidemia 02/12/2019   Past Surgical History:  Procedure Laterality Date   LAPAROSCOPIC PARTIAL COLECTOMY N/A 09/13/2017   Procedure: LAPAROSCOPIC SIGMOIDECTOMY COLECTOMY ERAS PATHWAY;  Surgeon: Romie Levee, MD;  Location: [facility];  Service: General;  Laterality: N/A;   VASECTOMY     WISDOM TOOTH EXTRACTION      Family History  Problem Relation Age of Onset   Hypertension Mother    Other Father        Car accident   Hypertension Father    Arthritis Father    Social History   Socioeconomic History   Marital status: Married    Spouse name: Not on file   Number of children: 2   Years of education: Not on file   Highest education level: Not on file  Occupational History   Not on file  Tobacco Use   Smoking status: Never   Smokeless tobacco: Never  Vaping Use   Vaping status:  Never Used  Substance and Sexual Activity   Alcohol use: Yes    Comment: weekends   Drug use: No   Sexual activity: Yes    Partners: Female  Other Topics Concern   Not on file  Social History Narrative   Not on file   Social Determinants of Health   Financial Resource Strain: Not on file  Food Insecurity: Not on file  Transportation Needs: Not on file  Physical Activity: Not on file  Stress: Not on file  Social Connections: Not on file    Objective:  BP 138/82 (BP Location: Left Arm, Patient Position: Sitting, Cuff Size: Large)   Pulse (!) 53   Temp 97.7 F (36.5 C) (Temporal)   Ht 5\' 11"  (1.803 m)   Wt 220 lb 3.2 oz (99.9 kg)   SpO2 97%   BMI 30.71 kg/m      04/27/2023    7:57 AM 10/27/2022    9:53 AM 10/27/2022    9:04 AM  BP/Weight  Systolic BP 138 144 142  Diastolic BP 82 72 60  Wt. (Lbs) 220.2  230.6  BMI 30.71 kg/m2  32.16 kg/m2    Physical Exam Vitals reviewed.  Constitutional:      Appearance: Normal appearance.  Cardiovascular:     Rate and Rhythm: Normal rate and regular rhythm.     Heart sounds: Normal heart sounds.  Pulmonary:     Effort: Pulmonary effort is normal.     Breath sounds: Normal breath sounds.  Abdominal:     General: Bowel sounds are normal.     Palpations: Abdomen is soft.     Tenderness: There is no abdominal tenderness.  Neurological:     Mental Status: He is alert and oriented to person, place, and time.  Psychiatric:        Mood and Affect: Mood normal.        Behavior: Behavior normal.     Diabetic Foot Exam - Simple   No data filed      Lab Results  Component Value Date   WBC 4.8 04/27/2023   HGB 15.1 04/27/2023   HCT 45.6 04/27/2023   PLT 289 04/27/2023   GLUCOSE 103 (H) 04/27/2023   CHOL 216 (H) 04/27/2023   TRIG 37 04/27/2023   HDL 70 04/27/2023   LDLCALC 140 (H) 04/27/2023   ALT 35 04/27/2023   AST 24 04/27/2023   NA  142 04/27/2023   K 5.2 04/27/2023   CL 103 04/27/2023   CREATININE 0.97  04/27/2023   BUN 17 04/27/2023   CO2 23 04/27/2023   TSH 1.430 10/27/2022   HGBA1C 5.5 10/27/2022      Assessment & Plan:    Mixed hyperlipidemia Assessment & Plan: Controlled Continue to monitor diet and exercise Labs drawn Will adjust treatment as needed  Orders: -     CBC with Differential/Platelet -     Comprehensive metabolic panel -     Lipid panel -     HIV Antibody (routine testing w rflx)  GAD (generalized anxiety disorder) Assessment & Plan: Controlled Denies needing medication currently  Continue to monitor symptoms  Will let us know if symptoms change   Immunization due -     Tdap vaccine greater than or equal to 7yo IM  Screening for viral disease -     Hepatitis C antibody     No orders of the defined types were placed in this encounter.   Orders Placed This Encounter  Procedures   Tdap vaccine greater than or equal to 7yo IM   CBC with Differential/Platelet   Comprehensive metabolic panel   Lipid panel   Hepatitis C antibody   HIV Antibody (routine testing w rflx)     Follow-up: Return in about 6 months (around 10/28/2023) for Chronic, Huston Foley.   I,Marla I Leal-Borjas,acting as a scribe for [employer], PA.,have documented all relevant documentation on the behalf of Langley Gauss, PA,as directed by  Langley Gauss, PA while in the presence of Langley Gauss, [place].   An After Visit Summary was printed and given to the patient.  Langley Gauss, [place] [facility] [phone]

## 2023-04-28 LAB — CBC WITH DIFFERENTIAL/PLATELET
Basophils Absolute: 0.1 10*3/uL (ref 0.0–0.2)
Basos: 1 %
EOS (ABSOLUTE): 0.1 10*3/uL (ref 0.0–0.4)
Eos: 2 %
Hematocrit: 45.6 % (ref 37.5–51.0)
Hemoglobin: 15.1 g/dL (ref 13.0–17.7)
Immature Grans (Abs): 0 10*3/uL (ref 0.0–0.1)
Immature Granulocytes: 0 %
Lymphocytes Absolute: 1.4 10*3/uL (ref 0.7–3.1)
Lymphs: 29 %
MCH: 30.9 pg (ref 26.6–33.0)
MCHC: 33.1 g/dL (ref 31.5–35.7)
MCV: 93 fL (ref 79–97)
Monocytes Absolute: 0.5 10*3/uL (ref 0.1–0.9)
Monocytes: 10 %
Neutrophils Absolute: 2.8 10*3/uL (ref 1.4–7.0)
Neutrophils: 58 %
Platelets: 289 10*3/uL (ref 150–450)
RBC: 4.88 x10E6/uL (ref 4.14–5.80)
RDW: 12.8 % (ref 11.6–15.4)
WBC: 4.8 10*3/uL (ref 3.4–10.8)

## 2023-04-28 LAB — COMPREHENSIVE METABOLIC PANEL
ALT: 35 IU/L (ref 0–44)
AST: 24 IU/L (ref 0–40)
Albumin: 4.8 g/dL (ref 3.8–4.9)
Alkaline Phosphatase: 60 IU/L (ref 44–121)
BUN/Creatinine Ratio: 18 (ref 9–20)
BUN: 17 mg/dL (ref 6–24)
Bilirubin Total: 0.5 mg/dL (ref 0.0–1.2)
CO2: 23 mmol/L (ref 20–29)
Calcium: 9.7 mg/dL (ref 8.7–10.2)
Chloride: 103 mmol/L (ref 96–106)
Creatinine, Ser: 0.97 mg/dL (ref 0.76–1.27)
Globulin, Total: 2 g/dL (ref 1.5–4.5)
Glucose: 103 mg/dL — ABNORMAL HIGH (ref 70–99)
Potassium: 5.2 mmol/L (ref 3.5–5.2)
Sodium: 142 mmol/L (ref 134–144)
Total Protein: 6.8 g/dL (ref 6.0–8.5)
eGFR: 92 mL/min/{1.73_m2} (ref >59)

## 2023-04-28 LAB — LIPID PANEL
Chol/HDL Ratio: 3.1 ratio (ref 0.0–5.0)
Cholesterol, Total: 216 mg/dL — ABNORMAL HIGH (ref 100–199)
HDL: 70 mg/dL (ref >39)
LDL Chol Calc (NIH): 140 mg/dL — ABNORMAL HIGH (ref 0–99)
Triglycerides: 37 mg/dL (ref 0–149)
VLDL Cholesterol Cal: 6 mg/dL (ref 5–40)

## 2023-04-30 ENCOUNTER — Encounter: Payer: Self-pay | Admitting: Physician Assistant

## 2023-04-30 DIAGNOSIS — Z23 Encounter for immunization: Secondary | ICD-10-CM

## 2023-04-30 DIAGNOSIS — Z1159 Encounter for screening for other viral diseases: Secondary | ICD-10-CM | POA: Insufficient documentation

## 2023-04-30 HISTORY — DX: Encounter for immunization: Z23

## 2023-04-30 HISTORY — DX: Encounter for screening for other viral diseases: Z11.59

## 2023-05-01 NOTE — Assessment & Plan Note (Signed)
Controlled Continue to monitor diet and exercise Labs drawn Will adjust treatment as needed

## 2023-05-01 NOTE — Assessment & Plan Note (Signed)
Controlled Denies needing medication currently  Continue to monitor symptoms  Will let us know if symptoms change

## 2023-05-08 ENCOUNTER — Other Ambulatory Visit: Payer: Self-pay | Admitting: Physician Assistant

## 2023-05-08 ENCOUNTER — Telehealth: Payer: Self-pay

## 2023-05-08 DIAGNOSIS — R5382 Chronic fatigue, unspecified: Secondary | ICD-10-CM

## 2023-05-08 DIAGNOSIS — Z1159 Encounter for screening for other viral diseases: Secondary | ICD-10-CM

## 2023-05-08 NOTE — Telephone Encounter (Signed)
The patient called to follow-up on the HIV, Hepatitis C, and testosterone blood work. Pt notified that I do not see an order for the testosterone test, however I see the order for the other two. I spoke to Marion with [facility] who stated that she does not have an order for the HIV or Hepatitis C and is unable to do an add on.  Huston Foley, would you like for the patient to come back in for a nurse visit for these three tests?

## 2023-05-09 NOTE — Telephone Encounter (Signed)
Patient notified. Appointment has been scheduled for this Friday at 7:30.

## 2023-05-11 ENCOUNTER — Other Ambulatory Visit: Payer: BC Managed Care – PPO

## 2023-05-18 ENCOUNTER — Other Ambulatory Visit: Payer: BC Managed Care – PPO

## 2023-05-18 DIAGNOSIS — Z1159 Encounter for screening for other viral diseases: Secondary | ICD-10-CM

## 2023-05-18 DIAGNOSIS — R5382 Chronic fatigue, unspecified: Secondary | ICD-10-CM

## 2023-05-21 LAB — TESTOSTERONE,FREE AND TOTAL
Testosterone, Free: 2.1 pg/mL — ABNORMAL LOW (ref 7.2–24.0)
Testosterone: 360 ng/dL (ref 264–916)

## 2023-05-21 LAB — HEPATITIS C ANTIBODY: Hep C Virus Ab: NONREACTIVE

## 2023-05-21 LAB — HIV ANTIBODY (ROUTINE TESTING W REFLEX): HIV Screen 4th Generation wRfx: NONREACTIVE

## 2023-05-25 ENCOUNTER — Other Ambulatory Visit: Payer: BC Managed Care – PPO

## 2023-05-25 DIAGNOSIS — E291 Testicular hypofunction: Secondary | ICD-10-CM

## 2023-05-29 LAB — PSA: Prostate Specific Ag, Serum: 0.5 ng/mL (ref 0.0–4.0)

## 2023-05-29 LAB — TESTOSTERONE,FREE AND TOTAL
Testosterone, Free: 6.1 pg/mL — ABNORMAL LOW (ref 7.2–24.0)
Testosterone: 434 ng/dL (ref 264–916)

## 2023-10-16 ENCOUNTER — Ambulatory Visit: Payer: Self-pay | Admitting: Family Medicine

## 2023-10-16 NOTE — Telephone Encounter (Signed)
Copied from CRM [id number]. Topic: Clinical - Red Word Triage >> Oct 16, 2023  8:29 AM Geroge Baseman wrote: Red Word that prompted transfer to Nurse Triage: Extremely dizzy, tinnitus is worse, shortness of breath with activity. Last 3 days has gotten wors very concerned  Chief Complaint: Lightheadedness Symptoms: Worsening tinnitus, vision changes Frequency: On and off for a month Pertinent Negatives: Patient denies fainting or falling Disposition: [] ED /[] Urgent Care (no appt availability in office) / [x] Appointment(In office/virtual)/ []  [place] [facility]/ [] Home Care/ [] Refused Recommended Disposition /[]  Mobile Bus/ []  Follow-up with PCP Additional Notes: Patient called in to report episodes of lightheadedness that have been worsening over the past month. Patient has also noticed that his tinnitus has been worsening. Patient denies fainting, falling and vomiting. Patient stated he is still able to walk around normally and does not have to brace on to anything to keep his balance. Patient stated the lightlessness worsens during heavy weightlifting. This RN advised patient to be seen within 3 days. No availability with PCP. Scheduled patient in the office on Thursday morning with an alternate PCP. This RN advised patient to call back if symptoms worsen. Patient complied.   Reason for Disposition  [1] MILD dizziness (e.g., walking normally) AND [2] has NOT been evaluated by doctor (or NP/PA) for this  (Exception: Dizziness caused by heat exposure, sudden standing, or poor fluid intake.)  Answer Assessment - Initial Assessment Questions 1. DESCRIPTION: "Describe your dizziness."     States feeling is more lightheadedness than dizziness 2. LIGHTHEADED: "Do you feel lightheaded?" (e.g., somewhat faint, woozy, weak upon standing)     Denies fainting or falls 3. VERTIGO: "Do you feel like either you or the room is spinning or tilting?" (i.e. vertigo)     Denies room spinning or tilting 4.  SEVERITY: "How bad is it?"  "Do you feel like you are going to faint?" "Can you stand and walk?"   - MILD: Feels slightly dizzy, but walking normally.   - MODERATE: Feels unsteady when walking, but not falling; interferes with normal activities (e.g., school, work).   - SEVERE: Unable to walk without falling, or requires assistance to walk without falling; feels like passing out now.      Mild- states he does not have to brace himself when moving around  5. ONSET:  "When did the dizziness begin?"     On and off for a month, worsening recently  6. AGGRAVATING FACTORS: "Does anything make it worse?" (e.g., standing, change in head position)     Squatting or lifting heavier at the gym 8. CAUSE: "What do you think is causing the dizziness?"     States he has a history of sinus issues 9. RECURRENT SYMPTOM: "Have you had dizziness before?" If Yes, ask: "When was the last time?" "What happened that time?"     Denies 10. OTHER SYMPTOMS: "Do you have any other symptoms?" (e.g., fever, chest pain, vomiting, diarrhea, bleeding)       Vision changes, feels more "out of breath" when lifting weights, fluctuation in urinary frequency, worsening tinnitus, denies chest pain  Protocols used: Dizziness - Lightheadedness-A-AH

## 2023-10-17 ENCOUNTER — Ambulatory Visit: Payer: Self-pay | Admitting: Family Medicine

## 2023-10-17 NOTE — Telephone Encounter (Signed)
Please see CRM [id number].  Copied from CRM [id number]. Topic: Appointments - Appointment Cancel/Reschedule >> Oct 17, 2023 12:09 PM Gery Pray wrote: Patient/patient representative is calling to cancel or reschedule an appointment. Refer to attachments for appointment information. Patient is lightheaded, extremely dizzy, tinnitus, shortness of breath with activity and gotten worse since he last called and scheduled the appointment. Patient would like to know if at all possible if he could get an appointment today due to symptoms worsening.

## 2023-10-17 NOTE — Telephone Encounter (Addendum)
  Chief Complaint: would like a sooner appointment Symptoms: dizziness Frequency: more frequent since this AM Disposition: [x] ED /[x] Urgent Care (no appt availability in office) / [] Appointment(In office/virtual)/ []  [place] [facility]/ [] Home Care/ [] Refused Recommended Disposition /[] [place] [facility]/ []  Follow-up with PCP Additional Notes: Patient triaged yesterday by RN Denny Peon. Patient states he thinks he should not wait til tomorrow to be seen, he state since 10am today the dizziness has been present more. Advised patient to go to ED or urgent care as there are no appointments available with any PCP locations for today. Patient states he will go to the [facility] in [place] if needed but would like to keep his appointment with PCP for tomorrow.

## 2023-10-18 ENCOUNTER — Telehealth: Payer: Self-pay

## 2023-10-18 ENCOUNTER — Encounter: Payer: Self-pay | Admitting: Family Medicine

## 2023-10-18 ENCOUNTER — Ambulatory Visit: Payer: Self-pay | Admitting: Family Medicine

## 2023-10-18 ENCOUNTER — Ambulatory Visit: Payer: 59 | Attending: Family Medicine

## 2023-10-18 VITALS — BP 142/64 | HR 54 | Temp 97.8°F | Ht 71.0 in | Wt 221.0 lb

## 2023-10-18 DIAGNOSIS — R42 Dizziness and giddiness: Secondary | ICD-10-CM

## 2023-10-18 DIAGNOSIS — R001 Bradycardia, unspecified: Secondary | ICD-10-CM

## 2023-10-18 DIAGNOSIS — R030 Elevated blood-pressure reading, without diagnosis of hypertension: Secondary | ICD-10-CM

## 2023-10-18 DIAGNOSIS — R5383 Other fatigue: Secondary | ICD-10-CM | POA: Diagnosis not present

## 2023-10-18 DIAGNOSIS — R0609 Other forms of dyspnea: Secondary | ICD-10-CM

## 2023-10-18 DIAGNOSIS — E782 Mixed hyperlipidemia: Secondary | ICD-10-CM

## 2023-10-18 DIAGNOSIS — R7301 Impaired fasting glucose: Secondary | ICD-10-CM

## 2023-10-18 HISTORY — DX: Other forms of dyspnea: R06.09

## 2023-10-18 HISTORY — DX: Other fatigue: R53.83

## 2023-10-18 HISTORY — DX: Impaired fasting glucose: R73.01

## 2023-10-18 HISTORY — DX: Dizziness and giddiness: R42

## 2023-10-18 HISTORY — DX: Bradycardia, unspecified: R00.1

## 2023-10-18 NOTE — Assessment & Plan Note (Signed)
Intermittent over the past month, with no clear triggers. No associated vertigo, but some associated mild headaches and visual disturbances. No history of seizures or hallucinations. -Order lab work today to investigate potential causes, including B12 deficiency. -Order echocardiogram and Holter monitor to rule out arrhythmia and assess ejection fraction (previous EF 45-50% in 2020).

## 2023-10-18 NOTE — Assessment & Plan Note (Signed)
EKG ordered - Sinus bradycardia NSR - Ventricular rate of 45, PR 166, QRS - 126, QTc - 401, no ST elevation or depression.

## 2023-10-18 NOTE — Assessment & Plan Note (Signed)
Labs drawn, Await labs/testing for assessment and recommendations

## 2023-10-18 NOTE — Telephone Encounter (Signed)
Copied from CRM [id number]. Topic: General - Other >> Oct 18, 2023 12:13 PM [name] H wrote: Reason for CRM: Patient says when he was called to schedule cardiac echo, they asked him about a heart monitor. He said he was not given a heart monitor from the office and was wondering if it was going to be mailed to him or how does he get the heart monitor.  Patient would like a call back, [phone]

## 2023-10-18 NOTE — Progress Notes (Unsigned)
EP to read.

## 2023-10-18 NOTE — Assessment & Plan Note (Signed)
LDL has fluctuated, with a high of 180 and a low of 99. Patient notes that low-carb diet seems to increase LDL. Lab Results  Component Value Date   LDLCALC 140 (H) 04/27/2023    -Continue monitoring, encourage balanced diet.

## 2023-10-18 NOTE — Telephone Encounter (Signed)
Called patient and made him aware he will get the heart monitor in the mail, recommend he call Echo people to make sure he is okay to wear it during the echo if not he will need to make sure his appointment is 14 days out before he wear heart monitor or apply heart monitor after Echo because he has to wear it for 14 days.  Patient Made Aware, Verbalized Understanding.

## 2023-10-18 NOTE — Progress Notes (Signed)
Acute Office Visit  Subjective:    Patient ID: Noah Clark, male    DOB: 03-04-1966, 58 y.o.   MRN: [id number]  Chief Complaint  Patient presents with   Dizziness    Discussed the use of AI scribe software for clinical note transcription with the patient, who gave verbal consent to proceed.   HPI: Noah Clark is a 58 year old male who presents with lightheadedness, extreme dizziness, tinnitus, shortness of breath with activity, all of which comes and goes.   He has been experiencing dizziness and lightheadedness for over a month. Initially, the symptoms appeared for a couple of weeks, subsided, and then returned more intensely. The sensation is described as lightheadedness without any episodes of syncope or falls. The symptoms occur without a specific trigger and can happen at various times, such as when walking into a store. They can resolve spontaneously, as he did around noon the previous day. No history of seizures, hallucinations, or vertigo, although brief dizziness has occurred when standing up too fast in the past.  He experiences shortness of breath primarily during exercise, particularly during leg workouts, but not when lying down. No significant leg swelling, although some water retention is noted when his diet is uncontrolled over the weekend. Blood pressure readings have varied, with an average around the 130s, ranging from the 120s to the 140s. He is not currently on any medications for blood pressure and prefers to avoid them if possible.  He reports a history of tinnitus for about 15 years, which has intensified over the last couple of weeks. He also experiences mild headaches, rated as a 1 on a scale of 1 to 10, and occasional visual disturbances such as a bright streak of light, which occurred a few weeks ago. He notes some hearing loss, likely due to the persistent tinnitus.  His LDL cholesterol levels have fluctuated, with a recent reading of 140, and he notes that  dietary changes seem to affect these levels. He has not experienced any significant weight changes, fever, or night sweats.  He quit drinking alcohol 82 days ago, which has improved his sleep and reduced his anxiety.  He describes his urine as light yellow, indicating adequate hydration, although he admits to drinking more Diet Cokes than water. He has not had a recent eye exam but undergoes DOT physicals every two years.  He has not experienced any significant fatigue, although he works long hours and exercises regularly, except for the past two weeks due to his symptoms.   Past Medical History:  Diagnosis Date   Anxiety    Asthma    as a child   Cancer (HCC)    colon cancer   Colon cancer (HCC) 09/13/2017   Heart murmur    Mixed dyslipidemia 02/12/2019    Past Surgical History:  Procedure Laterality Date   LAPAROSCOPIC PARTIAL COLECTOMY N/A 09/13/2017   Procedure: LAPAROSCOPIC SIGMOIDECTOMY COLECTOMY ERAS PATHWAY;  Surgeon: Romie Levee, MD;  Location: [facility];  Service: General;  Laterality: N/A;   VASECTOMY     WISDOM TOOTH EXTRACTION      Family History  Problem Relation Age of Onset   Hypertension Mother    Other Father        Car accident   Hypertension Father    Arthritis Father     Social History   Socioeconomic History   Marital status: Married    Spouse name: Not on file   Number of children: 2  Years of education: Not on file   Highest education level: Not on file  Occupational History   Not on file  Tobacco Use   Smoking status: Never   Smokeless tobacco: Never  Vaping Use   Vaping status: Never Used  Substance and Sexual Activity   Alcohol use: Yes    Comment: weekends   Drug use: No   Sexual activity: Yes    Partners: Female  Other Topics Concern   Not on file  Social History Narrative   Not on file   Social Drivers of Health   Financial Resource Strain: Low Risk  (10/18/2023)   Overall Financial Resource Strain (CARDIA)    Difficulty  of Paying Living Expenses: Not hard at all  Food Insecurity: No Food Insecurity (10/18/2023)   Hunger Vital Sign    Worried About Running Out of Food in the Last Year: Never true    Ran Out of Food in the Last Year: Never true  Transportation Needs: No Transportation Needs (10/18/2023)   PRAPARE - Administrator, Civil Service (Medical): No    Lack of Transportation (Non-Medical): No  Physical Activity: Sufficiently Active (10/18/2023)   Exercise Vital Sign    Days of Exercise per Week: 4 days    Minutes of Exercise per Session: 60 min  Stress: No Stress Concern Present (10/18/2023)   [employer] of Occupational Health - Occupational Stress Questionnaire    Feeling of Stress : Not at all  Social Connections: Moderately Integrated (10/18/2023)   Social Connection and Isolation Panel [NHANES]    Frequency of Communication with Friends and Family: More than three times a week    Frequency of Social Gatherings with Friends and Family: More than three times a week    Attends Religious Services: More than 4 times per year    Active Member of [employer] or Organizations: No    Attends Banker Meetings: Never    Marital Status: Married  Catering manager Violence: Not At Risk (10/18/2023)   Humiliation, Afraid, Rape, and Kick questionnaire    Fear of Current or Ex-Partner: No    Emotionally Abused: No    Physically Abused: No    Sexually Abused: No    No outpatient medications prior to visit.   No facility-administered medications prior to visit.    No Known Allergies  Review of Systems  Constitutional:  Positive for fatigue. Negative for chills, diaphoresis and fever.  HENT:  Positive for tinnitus. Negative for congestion, ear pain and sinus pain.   Eyes:  Positive for visual disturbance (streak of light - bright).  Respiratory:  Positive for shortness of breath (on exertion). Negative for cough.   Cardiovascular:  Negative for chest pain.  Gastrointestinal:   Negative for abdominal pain, constipation, diarrhea, nausea and vomiting.  Musculoskeletal:  Negative for myalgias.  Neurological:  Positive for light-headedness (on and off for about a month) and headaches (mild). Negative for seizures and speech difficulty.  Psychiatric/Behavioral:  Negative for dysphoric mood and sleep disturbance. The patient is nervous/anxious (gone since sobriety).        Objective:        10/18/2023    7:58 AM 04/27/2023    7:57 AM 10/27/2022    9:53 AM  Vitals with BMI  Height 5\' 11"  5\' 11"    Weight 221 lbs 220 lbs 3 oz   BMI 30.84 30.73   Systolic 142 138 161  Diastolic 64 82 72  Pulse 54 53  Orthostatic VS for the past 72 hrs (Last 3 readings):  Orthostatic BP Patient Position BP Location Orthostatic Pulse  10/18/23 1047 132/78 Standing Left Arm 54  10/18/23 1046 142/68 Sitting Left Arm (!) 48  10/18/23 1045 144/78 Supine Left Arm (!) 42     Physical Exam Constitutional:      General: He is not in acute distress.    Appearance: Normal appearance. He is not ill-appearing.  HENT:     Right Ear: Tympanic membrane normal.     Left Ear: Tympanic membrane normal.  Eyes:     Conjunctiva/sclera: Conjunctivae normal.  Neck:     Vascular: No carotid bruit.  Cardiovascular:     Rate and Rhythm: Regular rhythm. Bradycardia present.     Heart sounds: Murmur heard.     Friction rub: murmur.  Pulmonary:     Effort: Pulmonary effort is normal.     Breath sounds: Normal breath sounds. No wheezing.  Musculoskeletal:        General: Normal range of motion.  Skin:    General: Skin is warm.  Neurological:     Mental Status: He is alert. Mental status is at baseline.  Psychiatric:        Mood and Affect: Mood normal.        Behavior: Behavior normal.     There are no preventive care reminders to display for this patient.   There are no preventive care reminders to display for this patient.   Lab Results  Component Value Date   TSH 1.430  10/27/2022   Lab Results  Component Value Date   WBC 4.8 04/27/2023   HGB 15.1 04/27/2023   HCT 45.6 04/27/2023   MCV 93 04/27/2023   PLT 289 04/27/2023   Lab Results  Component Value Date   NA 142 04/27/2023   K 5.2 04/27/2023   CO2 23 04/27/2023   GLUCOSE 103 (H) 04/27/2023   BUN 17 04/27/2023   CREATININE 0.97 04/27/2023   BILITOT 0.5 04/27/2023   ALKPHOS 60 04/27/2023   AST 24 04/27/2023   ALT 35 04/27/2023   PROT 6.8 04/27/2023   ALBUMIN 4.8 04/27/2023   CALCIUM 9.7 04/27/2023   ANIONGAP 6 09/15/2017   EGFR 92 04/27/2023   Lab Results  Component Value Date   CHOL 216 (H) 04/27/2023   Lab Results  Component Value Date   HDL 70 04/27/2023   Lab Results  Component Value Date   LDLCALC 140 (H) 04/27/2023   Lab Results  Component Value Date   TRIG 37 04/27/2023   Lab Results  Component Value Date   CHOLHDL 3.1 04/27/2023   Lab Results  Component Value Date   HGBA1C 5.5 10/27/2022       Assessment & Plan:  Intermittent lightheadedness Assessment & Plan: Intermittent over the past month, with no clear triggers. No associated vertigo, but some associated mild headaches and visual disturbances. No history of seizures or hallucinations. -Order lab work today to investigate potential causes, including B12 deficiency. -Order echocardiogram and Holter monitor to rule out arrhythmia and assess ejection fraction (previous EF 45-50% in 2020).  Orders: -     Comprehensive metabolic panel -     CBC with Differential/Platelet -     TSH -     Vitamin B1 -     Vitamin B12 -     ECHOCARDIOGRAM COMPLETE; Future -     LONG TERM MONITOR (3-14 DAYS); Future -     Ambulatory referral to  Cardiology -     EKG 12-Lead  Dyspnea on exertion Assessment & Plan: EKG ordered - Sinus bradycardia NSR - Ventricular rate of 45, PR 166, QRS - 126, QTc - 401, no ST elevation or depression.  Orders: -     EKG 12-Lead  Other fatigue Assessment & Plan: Labs drawn, Await  labs/testing for assessment and recommendations   Orders: -     VITAMIN D 25 Hydroxy (Vit-D Deficiency, Fractures)  Elevated blood pressure reading without diagnosis of hypertension Assessment & Plan: Documented blood pressures in the 140s since February, but patient prefers to avoid medication. -Continue monitoring, encourage lifestyle modifications.   Mixed hyperlipidemia Assessment & Plan: LDL has fluctuated, with a high of 180 and a low of 99. Patient notes that low-carb diet seems to increase LDL. Lab Results  Component Value Date   LDLCALC 140 (H) 04/27/2023    -Continue monitoring, encourage balanced diet.   Sinus bradycardia by electrocardiogram -     ECHOCARDIOGRAM COMPLETE; Future -     LONG TERM MONITOR (3-14 DAYS); Future -     Ambulatory referral to Cardiology  Elevated fasting blood sugar -     Hemoglobin A1c     No orders of the defined types were placed in this encounter.   Orders Placed This Encounter  Procedures   Comprehensive metabolic panel   CBC with Differential   TSH   Hemoglobin A1c   Vitamin B1   Vitamin B12   Vitamin D, 25-hydroxy   Ambulatory referral to Cardiology   LONG TERM MONITOR (3-14 DAYS)   EKG 12-Lead   ECHOCARDIOGRAM COMPLETE     Follow-up: Return for Keep scheduled appointment 10/26/23.  An After Visit Summary was printed and given to the patient.  Total time spent on today's visit was 48 minutes, including both face-to-face time and nonface-to-face time personally spent on review of chart (labs and imaging), discussing labs and goals, discussing further work-up, treatment options, referrals to specialist if needed, reviewing outside records if pertinent, answering patient's questions, and coordinating care.    Lajuana Matte, FNP [facility] [phone]

## 2023-10-18 NOTE — Assessment & Plan Note (Signed)
Documented blood pressures in the 140s since February, but patient prefers to avoid medication. -Continue monitoring, encourage lifestyle modifications.

## 2023-10-19 DIAGNOSIS — R011 Cardiac murmur, unspecified: Secondary | ICD-10-CM | POA: Insufficient documentation

## 2023-10-19 DIAGNOSIS — F419 Anxiety disorder, unspecified: Secondary | ICD-10-CM | POA: Insufficient documentation

## 2023-10-19 DIAGNOSIS — J45909 Unspecified asthma, uncomplicated: Secondary | ICD-10-CM | POA: Insufficient documentation

## 2023-10-19 DIAGNOSIS — C801 Malignant (primary) neoplasm, unspecified: Secondary | ICD-10-CM | POA: Insufficient documentation

## 2023-10-19 NOTE — Telephone Encounter (Signed)
Duplicate CRM. See previous encounter for triage details.

## 2023-10-22 ENCOUNTER — Ambulatory Visit: Payer: 59

## 2023-10-22 VITALS — BP 135/78 | HR 57 | Ht 71.0 in | Wt 218.4 lb

## 2023-10-22 DIAGNOSIS — R42 Dizziness and giddiness: Secondary | ICD-10-CM

## 2023-10-22 DIAGNOSIS — R001 Bradycardia, unspecified: Secondary | ICD-10-CM | POA: Diagnosis not present

## 2023-10-22 DIAGNOSIS — R079 Chest pain, unspecified: Secondary | ICD-10-CM | POA: Diagnosis not present

## 2023-10-22 DIAGNOSIS — I429 Cardiomyopathy, unspecified: Secondary | ICD-10-CM

## 2023-10-22 DIAGNOSIS — R5383 Other fatigue: Secondary | ICD-10-CM | POA: Diagnosis not present

## 2023-10-22 DIAGNOSIS — E782 Mixed hyperlipidemia: Secondary | ICD-10-CM

## 2023-10-22 HISTORY — DX: Cardiomyopathy, unspecified: I42.9

## 2023-10-22 MED ORDER — METOPROLOL TARTRATE 25 MG PO TABS: 25.0000 mg | ORAL_TABLET | Freq: Once | ORAL | 0 refills | Status: DC

## 2023-10-22 MED ORDER — ASPIRIN 81 MG PO TBEC: 81.0000 mg | DELAYED_RELEASE_TABLET | Freq: Every day | ORAL | 3 refills | Status: AC

## 2023-10-22 NOTE — Assessment & Plan Note (Signed)
 Atypical chest pain, associated with palpitations and intermittent lightheadedness as above. 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 -Will obtain transthoracic echocardiogram to assess cardiac structure and function.  Empirically advised him to start taking aspirin 81 mg once daily, reviewed potential risk benefits and side effects.

## 2023-10-22 NOTE — Assessment & Plan Note (Addendum)
 Over the last few months. Symptoms of palpitations appear to have improved since a quit alcohol consumption. He does not have any episodes of syncope. Atypical symptoms with a competent of headache, vision changes, tinnitus, atypical chest pain and shortness of breath.  He does have cardiovascular risk factors in the form of prior LV dysfunction and fixed perfusion defect on nuclear imaging back in 2020.  Agree with 14-day event monitor, to be initiated today in the office he has a device available with him as ordered by his PCP.  Will review the results once available.  Further workup with echocardiogram, cardiac CT imaging as reviewed below under chest pain.  Will also obtain ultrasound carotid vessels.

## 2023-10-22 NOTE — Progress Notes (Addendum)
 Cardiology Consultation:    Date:  10/22/2023   ID:  Noah Clark, DOB [birth year], MRN [id number]  PCP:  Noah Gauss, PA  Cardiologist:  Noah Corporal Noah Steelman, MD   Referring MD: Noah Crigler, FNP   No chief complaint on file.    ASSESSMENT AND PLAN:   Mr. 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% back in July 2020, stress test with nuclear imaging at the time June 2020 with small apical anteroseptal scarlike appearance without any reversible perfusion defects at [facility], with no significant ongoing symptoms he continue to follow-up with his PCP, hyperlipidemia, moderate to heavy alcohol consumption up to 3 months ago [now quit in the setting of ongoing palpitations], history of colon cancer s/p resection  Problem List Items Addressed This Visit     Chest pain of uncertain etiology   Atypical chest pain, associated with palpitations and intermittent lightheadedness as above. 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 -Will obtain transthoracic echocardiogram to assess cardiac structure and function.  Empirically advised him to start taking aspirin 81 mg once daily, reviewed potential risk benefits and side effects.       Mixed hyperlipidemia   Last lipid panel from August 2024 total cholesterol 216, LDL 140, HDL 70, triglycerides 37. Was in the setting when he was still continuing to drink alcohol. Currently not on any therapy with statins.  Will repeat lipid panel. Getting cardiac workup as discussed below.   If he has evidence of coronary atherosclerosis will start statins.      Relevant Medications   aspirin EC 81 MG tablet   metoprolol tartrate (LOPRESSOR) 25 MG tablet   Intermittent lightheadedness - Primary   Over the last few months. Symptoms of palpitations appear  to have improved since a quit alcohol consumption. He does not have any episodes of syncope. Atypical symptoms with a competent of headache, vision changes, tinnitus, atypical chest pain and shortness of breath.  He does have cardiovascular risk factors in the form of prior LV dysfunction and fixed perfusion defect on nuclear imaging back in 2020.  Agree with 14-day event monitor, to be initiated today in the office he has a device available with him as ordered by his PCP.  Will review the results once available.  Further workup with echocardiogram, cardiac CT imaging as reviewed below under chest pain.  Will also obtain ultrasound carotid vessels.      Relevant Orders   EKG 12-Lead (Completed)   LONG TERM MONITOR (3-14 DAYS)   ECHOCARDIOGRAM COMPLETE   VAS US CAROTID   Comp Met (CMET)   Magnesium   Pro b natriuretic peptide (BNP)   CBC   Lipid Profile   TSH+T4F+T3Free   CT CORONARY MORPH W/CTA COR W/SCORE W/CA W/CM &/OR WO/CM   Sinus bradycardia by electrocardiogram   Baseline sinus rhythm with heart rate in 50s. No syncopal episodes. Could be a sign of good cardiac conditioning given his history of heart running on a regular basis.  No recent thyroid panel. Will obtain thyroid panel. Will review results from heart monitor has been performed with 14-day event monitor to assess for any significant bradycardia arrhythmia episodes or conduction abnormalities.      Cardiomyopathy (HCC)   Mild cardiomyopathy with LVEF 45 to 50% on prior echocardiogram from July 2020. Ischemic workup  at the time with Lexiscan stress test nuclear imaging June 2020 at [facility] noted LVEF 51% with small scar in apical anteroseptal region without any reversible perfusion defects to suggest ischemia.  At baseline good functional status. No further need well assessment. Obtaining a transthoracic echocardiogram as reviewed below along with coronary artery disease workup in the setting of  his ongoing symptoms of chest pain, palpitations, intermittent lightheadedness.      Relevant Medications   aspirin EC 81 MG tablet   metoprolol tartrate (LOPRESSOR) 25 MG tablet   Return to clinic tentatively in 4 weeks.  Earlier follow-up as needed based on test results.  Addendum 10-30-2023: He had an ER visit on 10-27-2023 for chest pain.  High-sensitivity troponins were unremarkable.  He underwent further evaluation with transthoracic echocardiogram and CT coronary angiogram. Normal biventricular function no significant wall motion abnormality, no significant valve abnormalities. CT coronary angiogram 10-28-2023 noted normal coronary origin, no significant coronary atherosclerosis, calcium score 0, total plaque volume 0, patent foramen ovale was observed. Will review results from ultrasound neck vessels and heart monitor once available.   History of Present Illness:    Noah Clark is a 58 y.o. male who is being seen today for the evaluation of intermittent lightheadedness and slow heart rate at the request of Noah Crigler, FNP  Pleasant gentleman with history of mild cardiomyopathy on echocardiogram LVEF 45 to 50% back in July 2020, stress test with nuclear imaging June 2020 with small apical anteroseptal scarlike appearance without any reversible perfusion defects, hyperlipidemia, moderate to heavy alcohol consumption until 3 months ago [now quit], history of colon cancer s/p surgical resection. In this context he followed up with Dr. Tomie Clark, last visit was a telehealth visit in July 2020 and recommended to continue with exercise and following up in 6 months.  He was lost to follow-up.  Here for the visit today accompanied by his wife. He drives a dump truck.  Exercises routinely, up to 3 times a week at the gym doing weight training.  Walks up to 2 miles, regularly 3 times a week with his wife for exercise.  Has been able to do these things without any limitation.  Back in 2020  after his follow-up with Dr. Tomie Clark he had been exercising regularly after 35 miles off walking and running a week.  Now for the last few months he has been noticing symptoms of intermittent lightheadedness, palpitations, tiredness. Few months ago with palpitations on and off mostly at nights when he lays down, noticeably on days he consumes alcohol, lasting for up to 30 seconds or so associated with lightheadedness and shortness of breath.  Relieves spontaneously.  He has since quit drinking alcohol for the last 86 days and has seen improvement in those palpitation episodes.  He however continues to feel tired and fatigued.  Describes a sense of atypical chest discomfort over the precordium and at times with tightness over the both sides of the neck.  This can occur randomly.  Describes this is associated with a sense of shortness of breath.   He also describes sense of lightheadedness associated with blurring of his vision and sparkling sensation in his visual fields.  Associated with headache and tinnitus and ringing in his years can last for few seconds.  Denies any focal weakness.  Denies any syncopal episodes.  Denies any falls.  He has not been taking any medications.  EKG in the clinic today shows sinus rhythm heart rate 57/min, PR interval  normal 154 ms, QRS duration 116 ms with left axis deviation suggest left anterior fascicle block.  QTc normal 408 ms.  Blood work apparently drawn at PCPs office on Thursday last week on 10-18-2023, no results available in the chart  Event monitor was set up 10-18-2023, currently pending he received it and mail not yet initiated.  He brought the device to the office visit today  Echocardiogram currently scheduled for November 07, 2023.  Lexiscan stress test with nuclear imaging from June 2020 at [facility] reported no reversible perfusion defects to suggest ischemia.  Small scar in apical anteroseptal region described.  LVEF 51%.  Prior  echocardiogram results from July 2020 reported LVEF mildly reduced 45 to 50%, normal RV size size and function, moderately dilated right atrium.  No significant valve abnormalities.  Last lipid panel from August 2024 total cholesterol 216, LDL 140, HDL 70, triglycerides 37   Past Medical History:  Diagnosis Date   Anxiety    Asthma    as a child   Bilateral impacted cerumen 10/27/2022   BMI 29.0-29.9,adult 09/23/2021   Cancer (HCC)    colon cancer   Colon cancer (HCC) 09/13/2017   Dyspnea on exertion 10/18/2023   Elevated blood pressure reading without diagnosis of hypertension 10/27/2022   Elevated fasting blood sugar 10/18/2023   GAD (generalized anxiety disorder) 10/27/2022   Heart murmur    Immunization due 04/30/2023   Intermittent lightheadedness 10/18/2023   Mixed dyslipidemia 02/12/2019   Mixed hyperlipidemia 02/12/2019   Other fatigue 10/18/2023   Screening for viral disease 04/30/2023   Sinus bradycardia by electrocardiogram 10/18/2023    Past Surgical History:  Procedure Laterality Date   LAPAROSCOPIC PARTIAL COLECTOMY N/A 09/13/2017   Procedure: LAPAROSCOPIC SIGMOIDECTOMY COLECTOMY ERAS PATHWAY;  Surgeon: Romie Levee, MD;  Location: [facility] ORS;  Service: General;  Laterality: N/A;   VASECTOMY     WISDOM TOOTH EXTRACTION      Current Medications: Current Meds  Medication Sig   aspirin EC 81 MG tablet Take 1 tablet (81 mg total) by mouth daily. Swallow whole.   metoprolol tartrate (LOPRESSOR) 25 MG tablet Take 1 tablet (25 mg total) by mouth once for 1 dose. Please take this medication 2 hours before CT.     Allergies:   Patient has no known allergies.   Social History   Socioeconomic History   Marital status: Married    Spouse name: Not on file   Number of children: 2   Years of education: Not on file   Highest education level: Not on file  Occupational History   Not on file  Tobacco Use   Smoking status: Never   Smokeless tobacco: Never  Vaping Use    Vaping status: Never Used  Substance and Sexual Activity   Alcohol use: Yes    Comment: weekends   Drug use: No   Sexual activity: Yes    Partners: Female  Other Topics Concern   Not on file  Social History Narrative   Not on file   Social Drivers of Health   Financial Resource Strain: Low Risk  (10/18/2023)   Overall Financial Resource Strain (CARDIA)    Difficulty of Paying Living Expenses: Not hard at all  Food Insecurity: No Food Insecurity (10/18/2023)   Hunger Vital Sign    Worried About Running Out of Food in the Last Year: Never true    Ran Out of Food in the Last Year: Never true  Transportation Needs: No Transportation Needs (10/18/2023)  PRAPARE - Administrator, Civil Service (Medical): No    Lack of Transportation (Non-Medical): No  Physical Activity: Sufficiently Active (10/18/2023)   Exercise Vital Sign    Days of Exercise per Week: 4 days    Minutes of Exercise per Session: 60 min  Stress: No Stress Concern Present (10/18/2023)   [employer] of Occupational Health - Occupational Stress Questionnaire    Feeling of Stress : Not at all  Social Connections: Moderately Integrated (10/18/2023)   Social Connection and Isolation Panel [NHANES]    Frequency of Communication with Friends and Family: More than three times a week    Frequency of Social Gatherings with Friends and Family: More than three times a week    Attends Religious Services: More than 4 times per year    Active Member of [employer] or Organizations: No    Attends Banker Meetings: Never    Marital Status: Married     Family History: The patient's family history includes Arthritis in his father; Hypertension in his father and mother; Other in his father. ROS:   Please see the history of present illness.    All 14 point review of systems negative except as described per history of present illness.  EKGs/Labs/Other Studies Reviewed:    The following studies were reviewed  today:   EKG:  EKG Interpretation Date/Time:  Monday October 22 2023 15:48:46 EST Ventricular Rate:  57 PR Interval:  154 QRS Duration:  116 QT Interval:  420 QTC Calculation: 408 R Axis:   -44  Text Interpretation: Sinus bradycardia Left axis deviation Abnormal ECG No previous ECGs available Confirmed by Huntley Dec reddy [id number]) on 10/22/2023 4:18:47 PM    Recent Labs: 10/27/2022: TSH 1.430 04/27/2023: ALT 35; BUN 17; Creatinine, Ser 0.97; Hemoglobin 15.1; Platelets 289; Potassium 5.2; Sodium 142  Recent Lipid Panel    Component Value Date/Time   CHOL 216 (H) 04/27/2023 0858   TRIG 37 04/27/2023 0858   HDL 70 04/27/2023 0858   CHOLHDL 3.1 04/27/2023 0858   LDLCALC 140 (H) 04/27/2023 0858    Physical Exam:    VS:  BP 135/78   Pulse (!) 57   Ht 5\' 11"  (1.803 m)   Wt 218 lb 6.4 oz (99.1 kg)   SpO2 98%   BMI 30.46 kg/m     Wt Readings from Last 3 Encounters:  10/22/23 218 lb 6.4 oz (99.1 kg)  10/18/23 221 lb (100.2 kg)  04/27/23 220 lb 3.2 oz (99.9 kg)     GENERAL:  Well nourished, well developed in no acute distress NECK: No JVD; No carotid bruits CARDIAC: RRR, S1 and S2 present, no murmurs, no rubs, no gallops CHEST:  Clear to auscultation without rales, wheezing or rhonchi  Extremities: No pitting pedal edema. Pulses bilaterally symmetric with radial 2+ and dorsalis pedis 2+ NEUROLOGIC:  Alert and oriented x 3  Medication Adjustments/Labs and Tests Ordered: Current medicines are reviewed at length with the patient today.  Concerns regarding medicines are outlined above.  Orders Placed This Encounter  Procedures   CT CORONARY MORPH W/CTA COR W/SCORE W/CA W/CM &/OR WO/CM   Comp Met (CMET)   Magnesium   Pro b natriuretic peptide (BNP)   CBC   Lipid Profile   TSH+T4F+T3Free   LONG TERM MONITOR (3-14 DAYS)   EKG 12-Lead   ECHOCARDIOGRAM COMPLETE   VAS US CAROTID   Meds ordered this encounter  Medications   aspirin EC 81 MG tablet  Sig: Take 1  tablet (81 mg total) by mouth daily. Swallow whole.    Dispense:  90 tablet    Refill:  3   metoprolol tartrate (LOPRESSOR) 25 MG tablet    Sig: Take 1 tablet (25 mg total) by mouth once for 1 dose. Please take this medication 2 hours before CT.    Dispense:  1 tablet    Refill:  0    Signed, Fredrika Canby reddy Loreen Bankson, MD, MPH, [facility]. 10/22/2023 5:16 PM    [facility]

## 2023-10-22 NOTE — Assessment & Plan Note (Signed)
 Last lipid panel from August 2024 total cholesterol 216, LDL 140, HDL 70, triglycerides 37. Was in the setting when he was still continuing to drink alcohol. Currently not on any therapy with statins.  Will repeat lipid panel. Getting cardiac workup as discussed below.   If he has evidence of coronary atherosclerosis will start statins.

## 2023-10-22 NOTE — Patient Instructions (Signed)
 Medication Instructions:  Your physician has recommended you make the following change in your medication:   START: Aspirin 81 mg daily  *If you need a refill on your cardiac medications before your next appointment, please call your pharmacy*   Lab Work: Your physician recommends that you return for lab work in:   Labs today: CMP, Magnesium, Pro BNP, CBC, Lipids, TSH T3 T4  If you have labs (blood work) drawn today and your tests are completely normal, you will receive your results only by: MyChart Message (if you have MyChart) OR A paper copy in the mail If you have any lab test that is abnormal or we need to change your treatment, we will call you to review the results.   Testing/Procedures: A zio monitor was ordered today. It will remain on for 14 days. You will then return monitor and event diary in provided box. It takes 1-2 weeks for report to be downloaded and returned to [place]. We will call you with the results. If monitor falls off or has orange flashing light, please call [employer] for further instructions.   Your physician has requested that you have an echocardiogram. Echocardiography is a painless test that uses sound waves to create images of your heart. It provides your doctor with information about the size and shape of your heart and how well your heart's chambers and valves are working. This procedure takes approximately one hour. There are no restrictions for this procedure. Please do NOT wear cologne, perfume, aftershave, or lotions (deodorant is allowed). Please arrive 15 minutes prior to your appointment time.  Please note: We ask at that you not bring children with you during ultrasound (echo/ vascular) testing. Due to room size and safety concerns, children are not allowed in the ultrasound rooms during exams. Our front office staff cannot provide observation of children in our lobby area while testing is being conducted. An adult accompanying a patient to their appointment  will only be allowed in the ultrasound room at the discretion of the ultrasound technician under special circumstances. We apologize for any inconvenience.  Your physician has requested that you have a carotid duplex. This test is an ultrasound of the carotid arteries in your neck. It looks at blood flow through these arteries that supply the brain with blood. Allow one hour for this exam. There are no restrictions or special instructions.    Your cardiac CT will be scheduled at one of the below locations:   [facility] [address] [place], [place] [address] [phone]  OR  [facility] [address] [place], [place] [address] [phone]  OR   [facility] [address] [place], [place] [address] [phone]  OR   [facility] [address] [place], [place] [address] [phone]  If scheduled at [facility], please arrive at the [facility] and Children's Entrance (Entrance C2) of [facility] 30 minutes prior to test start time. You can use the FREE valet parking offered at entrance C (encouraged to control the heart rate for the test)  Proceed to the [facility] Radiology Department (first floor) to check-in and test prep.  All radiology patients and guests should use entrance C2 at [facility], accessed from [facility], even though the hospital's physical address listed is [address]    If scheduled at [facility] or [facility], please arrive 68  mins early for check-in and test prep.  There is spacious parking and easy access to the radiology department from the [facility] Heart and Vascular entrance. Please enter here and check-in with the desk attendant.   If scheduled at [facility], please arrive 30 minutes early for check-in and test prep.  Please follow these  instructions carefully (unless otherwise directed):  An IV will be required for this test and Nitroglycerin will be given.  Hold all erectile dysfunction medications at least 3 days (72 hrs) prior to test. (Ie viagra, cialis, sildenafil, tadalafil, etc)   On the Night Before the Test: Be sure to Drink plenty of water. Do not consume any caffeinated/decaffeinated beverages or chocolate 12 hours prior to your test. Do not take any antihistamines 12 hours prior to your test.  On the Day of the Test: Drink plenty of water until 1 hour prior to the test. Do not eat any food 1 hour prior to test. You may take your regular medications prior to the test.  Take metoprolol (Lopressor) two hours prior to test.      After the Test: Drink plenty of water. After receiving IV contrast, you may experience a mild flushed feeling. This is normal. On occasion, you may experience a mild rash up to 24 hours after the test. This is not dangerous. If this occurs, you can take Benadryl 25 mg, Zyrtec, Claritin, or Allegra and increase your fluid intake. (Patients taking Tikosyn should avoid Benadryl, and may take Zyrtec, Claritin, or Allegra) If you experience trouble breathing, this can be serious. If it is severe call 911 IMMEDIATELY. If it is mild, please call our office.  We will call to schedule your test 2-4 weeks out understanding that some insurance companies will need an authorization prior to the service being performed.   For more information and frequently asked questions, please visit our website : [email or web address]  For non-scheduling related questions, please contact the cardiac imaging nurse navigator should you have any questions/concerns: Cardiac Imaging Nurse Navigators Direct Office Dial: [phone]   For scheduling needs, including cancellations and rescheduling, please call [place], [phone].    Follow-Up: At [facility], you and your health needs are  our priority.  As part of our continuing mission to provide you with exceptional heart care, we have created designated Provider Care Teams.  These Care Teams include your primary Cardiologist (physician) and Advanced Practice Providers (APPs -  Physician Assistants and Nurse Practitioners) who all work together to provide you with the care you need, when you need it.  We recommend signing up for the patient portal called "MyChart".  Sign up information is provided on this After Visit Summary.  MyChart is used to connect with patients for Virtual Visits (Telemedicine).  Patients are able to view lab/test results, encounter notes, upcoming appointments, etc.  Non-urgent messages can be sent to your provider as well.   To learn more about what you can do with MyChart, go to [email or web address].    Your next appointment:   4 week(s)  Provider:   Huntley Dec, MD    Other Instructions None

## 2023-10-22 NOTE — Assessment & Plan Note (Signed)
 Baseline sinus rhythm with heart rate in 50s. No syncopal episodes. Could be a sign of good cardiac conditioning given his history of heart running on a regular basis.  No recent thyroid panel. Will obtain thyroid panel. Will review results from heart monitor has been performed with 14-day event monitor to assess for any significant bradycardia arrhythmia episodes or conduction abnormalities.

## 2023-10-22 NOTE — Assessment & Plan Note (Signed)
 Mild cardiomyopathy with LVEF 45 to 50% on prior echocardiogram from July 2020. Ischemic workup at the time with Lexiscan stress test nuclear imaging June 2020 at [facility] noted LVEF 51% with small scar in apical anteroseptal region without any reversible perfusion defects to suggest ischemia.  At baseline good functional status. No further need well assessment. Obtaining a transthoracic echocardiogram as reviewed below along with coronary artery disease workup in the setting of his ongoing symptoms of chest pain, palpitations, intermittent lightheadedness.

## 2023-10-25 ENCOUNTER — Other Ambulatory Visit: Payer: Self-pay | Admitting: Family Medicine

## 2023-10-25 DIAGNOSIS — E559 Vitamin D deficiency, unspecified: Secondary | ICD-10-CM

## 2023-10-25 LAB — TSH: TSH: 2.13 u[IU]/mL (ref 0.450–4.500)

## 2023-10-25 LAB — COMPREHENSIVE METABOLIC PANEL
ALT: 26 [IU]/L (ref 0–44)
AST: 17 [IU]/L (ref 0–40)
Albumin: 4.8 g/dL (ref 3.8–4.9)
Alkaline Phosphatase: 67 [IU]/L (ref 44–121)
BUN/Creatinine Ratio: 14 (ref 9–20)
BUN: 13 mg/dL (ref 6–24)
Bilirubin Total: 0.6 mg/dL (ref 0.0–1.2)
CO2: 21 mmol/L (ref 20–29)
Calcium: 9.7 mg/dL (ref 8.7–10.2)
Chloride: 104 mmol/L (ref 96–106)
Creatinine, Ser: 0.94 mg/dL (ref 0.76–1.27)
Globulin, Total: 2 g/dL (ref 1.5–4.5)
Glucose: 100 mg/dL — ABNORMAL HIGH (ref 70–99)
Potassium: 4.9 mmol/L (ref 3.5–5.2)
Sodium: 140 mmol/L (ref 134–144)
Total Protein: 6.8 g/dL (ref 6.0–8.5)
eGFR: 95 mL/min/{1.73_m2} (ref >59)

## 2023-10-25 LAB — HEMOGLOBIN A1C
Est. average glucose Bld gHb Est-mCnc: 114 mg/dL
Hgb A1c MFr Bld: 5.6 % (ref 4.8–5.6)

## 2023-10-25 LAB — CBC WITH DIFFERENTIAL/PLATELET
Basophils Absolute: 0 10*3/uL (ref 0.0–0.2)
Basos: 1 %
EOS (ABSOLUTE): 0 10*3/uL (ref 0.0–0.4)
Eos: 1 %
Hematocrit: 45 % (ref 37.5–51.0)
Hemoglobin: 15.1 g/dL (ref 13.0–17.7)
Immature Grans (Abs): 0 10*3/uL (ref 0.0–0.1)
Immature Granulocytes: 0 %
Lymphocytes Absolute: 1.4 10*3/uL (ref 0.7–3.1)
Lymphs: 28 %
MCH: 30.4 pg (ref 26.6–33.0)
MCHC: 33.6 g/dL (ref 31.5–35.7)
MCV: 91 fL (ref 79–97)
Monocytes Absolute: 0.5 10*3/uL (ref 0.1–0.9)
Monocytes: 10 %
Neutrophils Absolute: 3 10*3/uL (ref 1.4–7.0)
Neutrophils: 60 %
Platelets: 297 10*3/uL (ref 150–450)
RBC: 4.97 x10E6/uL (ref 4.14–5.80)
RDW: 11.8 % (ref 11.6–15.4)
WBC: 4.9 10*3/uL (ref 3.4–10.8)

## 2023-10-25 LAB — VITAMIN B12: Vitamin B-12: 733 pg/mL (ref 232–1245)

## 2023-10-25 LAB — VITAMIN B1: Thiamine: 86.8 nmol/L (ref 66.5–200.0)

## 2023-10-25 LAB — VITAMIN D 25 HYDROXY (VIT D DEFICIENCY, FRACTURES): Vit D, 25-Hydroxy: 24.9 ng/mL — ABNORMAL LOW (ref 30.0–100.0)

## 2023-10-25 MED ORDER — VITAMIN D (ERGOCALCIFEROL) 1.25 MG (50000 UNIT) PO CAPS: 50000.0000 [IU] | ORAL_CAPSULE | ORAL | 0 refills | Status: DC

## 2023-10-26 ENCOUNTER — Encounter: Payer: Self-pay | Admitting: Physician Assistant

## 2023-10-26 ENCOUNTER — Ambulatory Visit (INDEPENDENT_AMBULATORY_CARE_PROVIDER_SITE_OTHER): Payer: 59 | Admitting: Physician Assistant

## 2023-10-26 VITALS — BP 118/62 | HR 59 | Temp 97.4°F | Ht 71.0 in | Wt 218.2 lb

## 2023-10-26 DIAGNOSIS — H9313 Tinnitus, bilateral: Secondary | ICD-10-CM

## 2023-10-26 DIAGNOSIS — I426 Alcoholic cardiomyopathy: Secondary | ICD-10-CM

## 2023-10-26 DIAGNOSIS — R42 Dizziness and giddiness: Secondary | ICD-10-CM

## 2023-10-26 DIAGNOSIS — E559 Vitamin D deficiency, unspecified: Secondary | ICD-10-CM

## 2023-10-26 DIAGNOSIS — R079 Chest pain, unspecified: Secondary | ICD-10-CM | POA: Diagnosis not present

## 2023-10-26 HISTORY — DX: Vitamin D deficiency, unspecified: E55.9

## 2023-10-26 HISTORY — DX: Tinnitus, bilateral: H93.13

## 2023-10-26 LAB — COMPREHENSIVE METABOLIC PANEL
ALT: 23 [IU]/L (ref 0–44)
AST: 17 [IU]/L (ref 0–40)
Albumin: 4.7 g/dL (ref 3.8–4.9)
Alkaline Phosphatase: 68 [IU]/L (ref 44–121)
BUN/Creatinine Ratio: 17 (ref 9–20)
BUN: 15 mg/dL (ref 6–24)
Bilirubin Total: 0.6 mg/dL (ref 0.0–1.2)
CO2: 24 mmol/L (ref 20–29)
Calcium: 9.8 mg/dL (ref 8.7–10.2)
Chloride: 102 mmol/L (ref 96–106)
Creatinine, Ser: 0.89 mg/dL (ref 0.76–1.27)
Globulin, Total: 2 g/dL (ref 1.5–4.5)
Glucose: 101 mg/dL — ABNORMAL HIGH (ref 70–99)
Potassium: 5.2 mmol/L (ref 3.5–5.2)
Sodium: 140 mmol/L (ref 134–144)
Total Protein: 6.7 g/dL (ref 6.0–8.5)
eGFR: 100 mL/min/{1.73_m2} (ref >59)

## 2023-10-26 NOTE — Assessment & Plan Note (Signed)
 Slightly elevated A1C and glucose levels. Patient has made dietary changes and has lost weight in the past. -Monitor A1C and glucose levels. -Encourage continued dietary changes and physical activity.

## 2023-10-26 NOTE — Progress Notes (Signed)
 Subjective:  Patient ID: Noah Clark, male    DOB: [birth year]  Age: 58 y.o. MRN: [id number]  Chief Complaint  Patient presents with   Hyperlipidemia   Anxiety    Hyperlipidemia Pertinent negatives include no chest pain or myalgias.  Anxiety Patient reports no chest pain, dizziness, nausea or nervous/anxious behavior.     Discussed the use of AI scribe software for clinical note transcription with the patient, who gave verbal consent to proceed.  History of Present Illness   The patient, with a history of heart issues, presents with lightheadedness and ringing in the ears. The lightheadedness is described as minor and occasional, but has been occurring more frequently in the past month. The patient also reports a constant low-grade headache that is not present 24/7 but occurs too often. The patient has noticed some blurry vision, which seems to fluctuate, sometimes seeing well and other times struggling with vision, particularly while driving. The patient also reports a worsening of the ringing in the ears over the past month, which is now almost in the head rather than the ears and is louder at times. The patient has been wearing a heart monitor and has several tests pending with a cardiologist, including an echocardiogram, a carotid artery scan, and a CT scan. The patient has also been referred to an audiologist for the ringing in the ears.          10/26/2023    7:39 AM 10/18/2023    8:08 AM 04/27/2023    7:56 AM 10/27/2022    9:11 AM 03/18/2021    7:36 AM  Depression screen PHQ 2/9  Decreased Interest 0 0 0 0 0  Down, Depressed, Hopeless 0 0 0 0 0  PHQ - 2 Score 0 0 0 0 0  Altered sleeping   1 2   Tired, decreased energy   1 3   Change in appetite   1 2   Feeling bad or failure about yourself    0 0   Trouble concentrating   1 1   Moving slowly or fidgety/restless   0 0   Suicidal thoughts   0 0   PHQ-9 Score   4 8   Difficult doing work/chores   Not difficult at all  Somewhat difficult         10/26/2023    7:39 AM  Fall Risk   Falls in the past year? 0  Number falls in past yr: 0  Injury with Fall? 0  Risk for fall due to : No Fall Risks    Patient Care Team: Langley Gauss, PA as PCP - General (Physician Assistant)   Review of Systems  Constitutional:  Negative for chills, fatigue and fever.  HENT:  Negative for congestion, ear pain, sinus pressure and sore throat.   Respiratory:  Negative for cough.   Cardiovascular:  Negative for chest pain.  Gastrointestinal:  Negative for abdominal pain, constipation, diarrhea, nausea and vomiting.  Genitourinary:  Negative for dysuria and frequency.  Musculoskeletal:  Negative for arthralgias, back pain and myalgias.  Neurological:  Negative for dizziness and headaches.  Psychiatric/Behavioral:  Negative for dysphoric mood. The patient is not nervous/anxious.     Current Outpatient Medications on File Prior to Visit  Medication Sig Dispense Refill   aspirin EC 81 MG tablet Take 1 tablet (81 mg total) by mouth daily. Swallow whole. 90 tablet 3   Vitamin D, Ergocalciferol, (DRISDOL) 1.25 MG (50000 UNIT) CAPS capsule Take 1 capsule (  50,000 Units total) by mouth every 7 (seven) days for 12 doses. 12 capsule 0   metoprolol tartrate (LOPRESSOR) 25 MG tablet Take 1 tablet (25 mg total) by mouth once for 1 dose. Please take this medication 2 hours before CT. (Patient not taking: Reported on 10/26/2023) 1 tablet 0   No current facility-administered medications on file prior to visit.   Past Medical History:  Diagnosis Date   Anxiety    Asthma    as a child   Bilateral impacted cerumen 10/27/2022   BMI 29.0-29.9,adult 09/23/2021   Cancer (HCC)    colon cancer   Colon cancer (HCC) 09/13/2017   Dyspnea on exertion 10/18/2023   Elevated blood pressure reading without diagnosis of hypertension 10/27/2022   Elevated fasting blood sugar 10/18/2023   GAD (generalized anxiety disorder) 10/27/2022   Heart murmur     Immunization due 04/30/2023   Intermittent lightheadedness 10/18/2023   Mixed dyslipidemia 02/12/2019   Mixed hyperlipidemia 02/12/2019   Other fatigue 10/18/2023   Screening for viral disease 04/30/2023   Sinus bradycardia by electrocardiogram 10/18/2023   Past Surgical History:  Procedure Laterality Date   LAPAROSCOPIC PARTIAL COLECTOMY N/A 09/13/2017   Procedure: LAPAROSCOPIC SIGMOIDECTOMY COLECTOMY ERAS PATHWAY;  Surgeon: Romie Levee, MD;  Location: [facility];  Service: General;  Laterality: N/A;   VASECTOMY     WISDOM TOOTH EXTRACTION      Family History  Problem Relation Age of Onset   Hypertension Mother    Other Father        Car accident   Hypertension Father    Arthritis Father    Social History   Socioeconomic History   Marital status: Married    Spouse name: Not on file   Number of children: 2   Years of education: Not on file   Highest education level: Not on file  Occupational History   Not on file  Tobacco Use   Smoking status: Never   Smokeless tobacco: Never  Vaping Use   Vaping status: Never Used  Substance and Sexual Activity   Alcohol use: Yes    Comment: weekends   Drug use: No   Sexual activity: Yes    Partners: Female  Other Topics Concern   Not on file  Social History Narrative   Not on file   Social Drivers of Health   Financial Resource Strain: Low Risk  (10/18/2023)   Overall Financial Resource Strain (CARDIA)    Difficulty of Paying Living Expenses: Not hard at all  Food Insecurity: No Food Insecurity (10/18/2023)   Hunger Vital Sign    Worried About Running Out of Food in the Last Year: Never true    Ran Out of Food in the Last Year: Never true  Transportation Needs: No Transportation Needs (10/18/2023)   PRAPARE - Administrator, Civil Service (Medical): No    Lack of Transportation (Non-Medical): No  Physical Activity: Sufficiently Active (10/18/2023)   Exercise Vital Sign    Days of Exercise per Week: 4 days     Minutes of Exercise per Session: 60 min  Stress: No Stress Concern Present (10/18/2023)   [employer] of Occupational Health - Occupational Stress Questionnaire    Feeling of Stress : Not at all  Social Connections: Moderately Integrated (10/18/2023)   Social Connection and Isolation Panel [NHANES]    Frequency of Communication with Friends and Family: More than three times a week    Frequency of Social Gatherings with Friends and Family:  More than three times a week    Attends Religious Services: More than 4 times per year    Active Member of Clubs or Organizations: No    Attends Banker Meetings: Never    Marital Status: Married    Objective:  BP 118/62   Pulse (!) 59   Temp (!) 97.4 F (36.3 C)   Ht 5\' 11"  (1.803 m)   Wt 218 lb 3.2 oz (99 kg)   SpO2 98%   BMI 30.43 kg/m      10/26/2023    7:37 AM 10/22/2023    3:47 PM 10/18/2023    7:58 AM  BP/Weight  Systolic BP 118 135 142  Diastolic BP 62 78 64  Wt. (Lbs) 218.2 218.4 221  BMI 30.43 kg/m2 30.46 kg/m2 30.82 kg/m2    Physical Exam Vitals reviewed.  Constitutional:      Appearance: Normal appearance.  HENT:     Right Ear: Decreased hearing noted. Swelling present. A middle ear effusion is present. Tympanic membrane is injected and bulging.     Left Ear: No decreased hearing noted. Swelling present. A middle ear effusion is present. Tympanic membrane is not injected or bulging.     Ears:     Weber exam findings: Does not lateralize.    Right Rinne: AC > BC.    Left Rinne: AC > BC. Neck:     Vascular: No carotid bruit.  Cardiovascular:     Rate and Rhythm: Regular rhythm. Bradycardia present.     Heart sounds: Normal heart sounds.  Pulmonary:     Effort: Pulmonary effort is normal.     Breath sounds: Normal breath sounds.  Abdominal:     General: Bowel sounds are normal.     Palpations: Abdomen is soft.     Tenderness: There is no abdominal tenderness.  Neurological:     Mental Status: He is  alert and oriented to person, place, and time.  Psychiatric:        Mood and Affect: Mood normal.        Behavior: Behavior normal.     Diabetic Foot Exam - Simple   No data filed      Lab Results  Component Value Date   WBC 4.9 10/18/2023   HGB 15.1 10/18/2023   HCT 45.0 10/18/2023   PLT 297 10/18/2023   GLUCOSE 100 (H) 10/18/2023   CHOL 216 (H) 04/27/2023   TRIG 37 04/27/2023   HDL 70 04/27/2023   LDLCALC 140 (H) 04/27/2023   ALT 26 10/18/2023   AST 17 10/18/2023   NA 140 10/18/2023   K 4.9 10/18/2023   CL 104 10/18/2023   CREATININE 0.94 10/18/2023   BUN 13 10/18/2023   CO2 21 10/18/2023   TSH 2.130 10/18/2023   HGBA1C 5.6 10/18/2023      Assessment & Plan:    Alcoholic cardiomyopathy (HCC) Assessment & Plan: History of lower ejection fraction (45-50% in 2020) with recent lightheadedness. No current symptoms of congestive heart failure. Cardiologist has ordered an echocardiogram, carotid ultrasound, and CT scan. -Continue with scheduled cardiac tests on 11/09/2023 and 11/13/2023. -Follow up with cardiologist on 11/23/2023.  Orders: -     Pro b natriuretic peptide (BNP)  Chest pain of uncertain etiology Assessment & Plan: Slightly elevated A1C and glucose levels. Patient has made dietary changes and has lost weight in the past. -Monitor A1C and glucose levels. -Encourage continued dietary changes and physical activity.  Orders: -  Lipid panel  Intermittent lightheadedness Assessment & Plan: Continue to follow up with cardiology.  Will follow up after Echo and Carotid Ultra sound Continue to monitor symptoms  Orders: -     T3, free -     T4, free -     Magnesium -     Comprehensive metabolic panel  Tinnitus aurium, bilateral Assessment & Plan: Progressive tinnitus over the past month, associated with chronic sinus congestion. No significant hearing loss reported. -Referral to audiologist for hearing tests. -Potential referral to ENT depending  on audiology results.  Orders: -     Ambulatory referral to Audiology  Vitamin D deficiency     No orders of the defined types were placed in this encounter.   Orders Placed This Encounter  Procedures   Pro b natriuretic peptide (BNP)   T3, Free   T4, Free   Magnesium   Lipid panel   Comprehensive metabolic panel   Ambulatory referral to Audiology  Follow-up -Return to clinic in mid-April 2025 for follow-up after cardiology appointments.       Follow-up: Return in about 8 weeks (around 12/21/2023) for Chronic, Huston Foley.   I,Candice Gribble,acting as a Neurosurgeon for [employer], PA.,have documented all relevant documentation on the behalf of Langley Gauss, PA,as directed by  Langley Gauss, PA while in the presence of Langley Gauss, [place].   An After Visit Summary was printed and given to the patient.  Langley Gauss, [place] [facility] [phone]

## 2023-10-26 NOTE — Assessment & Plan Note (Signed)
 Progressive tinnitus over the past month, associated with chronic sinus congestion. No significant hearing loss reported. -Referral to audiologist for hearing tests. -Potential referral to ENT depending on audiology results.

## 2023-10-26 NOTE — Assessment & Plan Note (Signed)
 Continue to follow up with cardiology.  Will follow up after Echo and Carotid Ultra sound Continue to monitor symptoms

## 2023-10-26 NOTE — Patient Instructions (Signed)
 VISIT SUMMARY:  During your visit, we discussed your recent symptoms, including lightheadedness, ringing in the ears, and occasional blurry vision. We reviewed your ongoing cardiac evaluation and planned further tests. We also addressed your tinnitus and sinus congestion, and discussed your prediabetes management.  YOUR PLAN:  -CARDIAC EVALUATION: You have a history of heart issues with a lower ejection fraction, which means your heart is not pumping as efficiently as it should. We will continue with the scheduled cardiac tests, including an echocardiogram, carotid ultrasound, and CT scan, to further evaluate your condition. Please follow up with your cardiologist on November 23, 2023.  -TINNITUS AND SINUS CONGESTION: Tinnitus is the ringing in your ears, which has worsened recently and is associated with sinus congestion. We have referred you to an audiologist for hearing tests, and depending on the results, you may also need to see an ENT specialist.  -PREDIABETES: Prediabetes means your blood sugar levels are higher than normal but not high enough to be classified as diabetes. We will continue to monitor your A1C and glucose levels. Please keep up with your dietary changes and physical activity to help manage this condition.  INSTRUCTIONS:  Please continue with your scheduled cardiac tests on November 09, 2023, and November 13, 2023, and follow up with your cardiologist on November 23, 2023. Return to our clinic in mid-April 2025 for a follow-up appointment after your cardiology visits.

## 2023-10-26 NOTE — Assessment & Plan Note (Signed)
 History of lower ejection fraction (45-50% in 2020) with recent lightheadedness. No current symptoms of congestive heart failure. Cardiologist has ordered an echocardiogram, carotid ultrasound, and CT scan. -Continue with scheduled cardiac tests on 11/09/2023 and 11/13/2023. -Follow up with cardiologist on 11/23/2023.

## 2023-10-27 ENCOUNTER — Other Ambulatory Visit: Payer: Self-pay

## 2023-10-27 ENCOUNTER — Encounter (HOSPITAL_COMMUNITY): Payer: Self-pay | Admitting: *Deleted

## 2023-10-27 ENCOUNTER — Observation Stay (HOSPITAL_BASED_OUTPATIENT_CLINIC_OR_DEPARTMENT_OTHER): Payer: 59

## 2023-10-27 ENCOUNTER — Emergency Department (HOSPITAL_COMMUNITY): Payer: 59

## 2023-10-27 ENCOUNTER — Observation Stay (HOSPITAL_COMMUNITY)
Admission: EM | Admit: 2023-10-27 | Discharge: 2023-10-28 | Disposition: A | Discharge status: Home / Self Care | Payer: 59 | Attending: Emergency Medicine | Admitting: Emergency Medicine

## 2023-10-27 DIAGNOSIS — R079 Chest pain, unspecified: Secondary | ICD-10-CM

## 2023-10-27 DIAGNOSIS — I420 Dilated cardiomyopathy: Secondary | ICD-10-CM

## 2023-10-27 DIAGNOSIS — J45909 Unspecified asthma, uncomplicated: Secondary | ICD-10-CM | POA: Diagnosis not present

## 2023-10-27 DIAGNOSIS — E782 Mixed hyperlipidemia: Secondary | ICD-10-CM | POA: Diagnosis present

## 2023-10-27 DIAGNOSIS — R072 Precordial pain: Secondary | ICD-10-CM

## 2023-10-27 DIAGNOSIS — F411 Generalized anxiety disorder: Secondary | ICD-10-CM | POA: Diagnosis present

## 2023-10-27 DIAGNOSIS — Z79899 Other long term (current) drug therapy: Secondary | ICD-10-CM | POA: Insufficient documentation

## 2023-10-27 DIAGNOSIS — Z7901 Long term (current) use of anticoagulants: Secondary | ICD-10-CM | POA: Insufficient documentation

## 2023-10-27 DIAGNOSIS — E66811 Obesity, class 1: Secondary | ICD-10-CM

## 2023-10-27 DIAGNOSIS — Z6830 Body mass index (BMI) 30.0-30.9, adult: Secondary | ICD-10-CM | POA: Diagnosis not present

## 2023-10-27 DIAGNOSIS — R001 Bradycardia, unspecified: Secondary | ICD-10-CM | POA: Diagnosis present

## 2023-10-27 DIAGNOSIS — K219 Gastro-esophageal reflux disease without esophagitis: Secondary | ICD-10-CM | POA: Insufficient documentation

## 2023-10-27 DIAGNOSIS — F1090 Alcohol use, unspecified, uncomplicated: Secondary | ICD-10-CM | POA: Insufficient documentation

## 2023-10-27 HISTORY — DX: Chest pain, unspecified: R07.9

## 2023-10-27 HISTORY — DX: Obesity, class 1: E66.811

## 2023-10-27 LAB — ECHOCARDIOGRAM COMPLETE
Area-P 1/2: 2.71 cm2
Height: 71 in
S' Lateral: 4.1 cm
Single Plane A4C EF: 51.1 %
Weight: 3459.2 [oz_av]

## 2023-10-27 LAB — MAGNESIUM: Magnesium: 2.3 mg/dL (ref 1.6–2.3)

## 2023-10-27 LAB — BASIC METABOLIC PANEL
Anion gap: 10 (ref 5–15)
BUN: 20 mg/dL (ref 6–20)
CO2: 22 mmol/L (ref 22–32)
Calcium: 9.1 mg/dL (ref 8.9–10.3)
Chloride: 105 mmol/L (ref 98–111)
Creatinine, Ser: 0.86 mg/dL (ref 0.61–1.24)
GFR, Estimated: >60 mL/min (ref >60)
Glucose, Bld: 118 mg/dL — ABNORMAL HIGH (ref 70–99)
Potassium: 4 mmol/L (ref 3.5–5.1)
Sodium: 137 mmol/L (ref 135–145)

## 2023-10-27 LAB — LIPID PANEL
Chol/HDL Ratio: 3.1 ratio (ref 0.0–5.0)
Cholesterol, Total: 209 mg/dL — ABNORMAL HIGH (ref 100–199)
HDL: 67 mg/dL (ref >39)
LDL Chol Calc (NIH): 134 mg/dL — ABNORMAL HIGH (ref 0–99)
Triglycerides: 47 mg/dL (ref 0–149)
VLDL Cholesterol Cal: 8 mg/dL (ref 5–40)

## 2023-10-27 LAB — TROPONIN I (HIGH SENSITIVITY)
Troponin I (High Sensitivity): 14 ng/L (ref <18)
Troponin I (High Sensitivity): 10 ng/L (ref <18)

## 2023-10-27 LAB — CREATININE, SERUM
Creatinine, Ser: 0.85 mg/dL (ref 0.61–1.24)
GFR, Estimated: >60 mL/min (ref >60)

## 2023-10-27 LAB — PRO B NATRIURETIC PEPTIDE: NT-Pro BNP: <36 pg/mL (ref 0–210)

## 2023-10-27 LAB — CBC
HCT: 42.8 % (ref 39.0–52.0)
Hemoglobin: 14.8 g/dL (ref 13.0–17.0)
MCH: 31.2 pg (ref 26.0–34.0)
MCHC: 34.6 g/dL (ref 30.0–36.0)
MCV: 90.1 fL (ref 80.0–100.0)
Platelets: 280 10*3/uL (ref 150–400)
RBC: 4.75 MIL/uL (ref 4.22–5.81)
RDW: 12.3 % (ref 11.5–15.5)
WBC: 7.2 10*3/uL (ref 4.0–10.5)
nRBC: 0 % (ref 0.0–0.2)

## 2023-10-27 LAB — T4, FREE: Free T4: 1.29 ng/dL (ref 0.82–1.77)

## 2023-10-27 LAB — T3, FREE: T3, Free: 3.2 pg/mL (ref 2.0–4.4)

## 2023-10-27 MED ORDER — MAGNESIUM GLUCONATE 500 MG PO TABS
500.0000 mg | ORAL_TABLET | Freq: Every day | ORAL | Status: DC
  Administered 2023-10-27: 500 mg via ORAL
  Filled 2023-10-27 (×2): qty 1

## 2023-10-27 MED ORDER — ASPIRIN 81 MG PO TBEC
81.0000 mg | DELAYED_RELEASE_TABLET | Freq: Every day | ORAL | Status: DC
  Administered 2023-10-27: 81 mg via ORAL
  Filled 2023-10-27: qty 1

## 2023-10-27 MED ORDER — ENOXAPARIN SODIUM 40 MG/0.4ML IJ SOSY
40.0000 mg | PREFILLED_SYRINGE | INTRAMUSCULAR | Status: DC
  Administered 2023-10-27 – 2023-10-28 (×2): 40 mg via SUBCUTANEOUS
  Filled 2023-10-27 (×2): qty 0.4

## 2023-10-27 MED ORDER — ONDANSETRON HCL 4 MG/2ML IJ SOLN: 4.0000 mg | Freq: Four times a day (QID) | INTRAMUSCULAR | Status: DC | PRN

## 2023-10-27 MED ORDER — ACETAMINOPHEN 650 MG RE SUPP: 650.0000 mg | Freq: Four times a day (QID) | RECTAL | Status: DC | PRN

## 2023-10-27 MED ORDER — ONDANSETRON HCL 4 MG PO TABS: 4.0000 mg | ORAL_TABLET | Freq: Four times a day (QID) | ORAL | Status: DC | PRN

## 2023-10-27 MED ORDER — SUCRALFATE 1 GM/10ML PO SUSP
1.0000 g | Freq: Three times a day (TID) | ORAL | Status: DC
  Administered 2023-10-27 – 2023-10-28 (×4): 1 g via ORAL
  Filled 2023-10-27 (×4): qty 10

## 2023-10-27 MED ORDER — PANTOPRAZOLE SODIUM 40 MG PO TBEC
40.0000 mg | DELAYED_RELEASE_TABLET | Freq: Every day | ORAL | Status: DC
  Administered 2023-10-27 – 2023-10-28 (×2): 40 mg via ORAL
  Filled 2023-10-27 (×2): qty 1

## 2023-10-27 MED ORDER — NITROGLYCERIN 0.4 MG SL SUBL
0.8000 mg | SUBLINGUAL_TABLET | Freq: Once | SUBLINGUAL | Status: AC
  Administered 2023-10-28: 0.8 mg via SUBLINGUAL
  Filled 2023-10-27: qty 2

## 2023-10-27 MED ORDER — ACETAMINOPHEN 325 MG PO TABS: 650.0000 mg | ORAL_TABLET | Freq: Four times a day (QID) | ORAL | Status: DC | PRN

## 2023-10-27 NOTE — Assessment & Plan Note (Signed)
 No signs of exacerbation.  He is not on bronchodilator therapy at home.

## 2023-10-27 NOTE — Assessment & Plan Note (Signed)
Calculated BMI is 30,4  

## 2023-10-27 NOTE — Assessment & Plan Note (Signed)
 Patient has a home cardiac monitor in place.  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  Continue telemetry monitoring.

## 2023-10-27 NOTE — Assessment & Plan Note (Signed)
 Not on medical therapy, follow up as outpatient.

## 2023-10-27 NOTE — ED Triage Notes (Signed)
 The pt is c/o chest pain  lt arm pain and  nausea tonight

## 2023-10-27 NOTE — Assessment & Plan Note (Addendum)
 Atypical symptoms, not frank angina.  EKG with no signs of ischemia, and high sensitive troponin are negative.   Echocardiogram from 2020 with LV systolic function mildly reduced to 45 to 50%, global hypokinesis with no regional wall motion abnormalities, preserved RV systolic function. RA with moderate dilatation,  no significant valvular disease.   Because of recurrent symptoms, patient may need repeat stress test, possible as outpatient, will follow up with cardiology recommendations.   Will do a trial of antiacid therapy, with pantoprazole and sucralfate.

## 2023-10-27 NOTE — H&P (Signed)
 History and Physical    Patient: Noah Clark ZOX:[id number] DOB: 09-18-1965 DOA: 10/27/2023 DOS: the patient was seen and examined on 10/27/2023 PCP: Langley Gauss, PA  Patient coming from: Home  Chief Complaint:  Chief Complaint  Patient presents with   Chest Pain   HPI: Noah Clark is a 58 y.o. male with medical history significant of asthma, history of colon cancer (sp resection), dyslipidemia, and anxiety disorder who presented with chest pain. Patient experienced chest pain while going to bed last night, he was lying supine when symptoms started. The pain was localized in the lower chest upper epigastric area, moderate to severe in intensity, burning in nature and radiated to his arms. It lasted about 3 hrs until he reached the ED.  He is physically active using the elliptical machine and lifting weights, he has no chest pain while doing exertion.  Currently he is using a ambulatory cardiac monitor prescribed by Dr. Vincent Gros, for bradycardia.   In 2020 he had cardiac work up with Lexiscan stress test with nuclear imaging, reported no reversible perfusion defects to suggest ischemia. Small scar in apical anteroseptal region described. LVEF 51%.   He denies any PND, orthopnea or lower extremity edema.    Review of Systems: As mentioned in the history of present illness. All other systems reviewed and are negative. Past Medical History:  Diagnosis Date   Anxiety    Asthma    as a child   Bilateral impacted cerumen 10/27/2022   BMI 29.0-29.9,adult 09/23/2021   Cancer (HCC)    colon cancer   Colon cancer (HCC) 09/13/2017   Dyspnea on exertion 10/18/2023   Elevated blood pressure reading without diagnosis of hypertension 10/27/2022   Elevated fasting blood sugar 10/18/2023   GAD (generalized anxiety disorder) 10/27/2022   Heart murmur    Immunization due 04/30/2023   Intermittent lightheadedness 10/18/2023   Mixed dyslipidemia 02/12/2019   Mixed hyperlipidemia 02/12/2019    Other fatigue 10/18/2023   Screening for viral disease 04/30/2023   Sinus bradycardia by electrocardiogram 10/18/2023   Past Surgical History:  Procedure Laterality Date   LAPAROSCOPIC PARTIAL COLECTOMY N/A 09/13/2017   Procedure: LAPAROSCOPIC SIGMOIDECTOMY COLECTOMY ERAS PATHWAY;  Surgeon: Romie Levee, MD;  Location: [facility];  Service: General;  Laterality: N/A;   VASECTOMY     WISDOM TOOTH EXTRACTION     Social History:  reports that he has never smoked. He has never used smokeless tobacco. He reports current alcohol use. He reports that he does not use drugs.  No Known Allergies  Family History  Problem Relation Age of Onset   Hypertension Mother    Other Father        Car accident   Hypertension Father    Arthritis Father     Prior to Admission medications   Medication Sig Start Date End Date Taking? Authorizing Provider  aspirin EC 81 MG tablet Take 1 tablet (81 mg total) by mouth daily. Swallow whole. Patient taking differently: Take 81 mg by mouth at bedtime. Swallow whole. 10/22/23  Yes Madireddy, Marlyn Corporal, MD  MAGNESIUM GLYCINATE PO Take 1 tablet by mouth at bedtime.   Yes [provider]  Vitamin D-Vitamin K (VITAMIN K2-VITAMIN D3 PO) Take 5,000 mcg by mouth at bedtime.   Yes [provider]    Physical Exam: Vitals:   10/27/23 0630 10/27/23 0642 10/27/23 0700 10/27/23 0759  BP: 125/67  109/64 114/64  Pulse: (!) 53  (!) 44 (!) 47  Resp: 18  14 18  Temp:  98.5 F (36.9 C)  98.3 F (36.8 C)  TempSrc:  Oral  Oral  SpO2: 96%  96% 94%  Weight:      Height:       Neurology awake and alert, ENT with no pallor or icterus Cardiovascular with S1 and S2 present and regular with no gallops, rubs or murmurs Respiratory with no rales, wheezing, or rhonchi Abdomen with no distention  No lower extremity edema  Data Reviewed:   Na 137, K 4,0 Cl 105 bicarbonate 22, glucose 118, bun 20 cr 0,86  High sensitive troponin 14 and 10  Wbc 7,2 hgb 14.8  plt 280   Chest radiograph with hypoinflation, no cardiomegaly, no infiltrates and no effusions.   EKG 47 bpm, left axis deviation, left anterior fascicular block, qtc 394, sinus rhythm with no significant ST segment or T wave changes.   Assessment and Plan: * Chest pain Atypical symptoms, not frank angina.  EKG with no signs of ischemia, and high sensitive troponin are negative.   Echocardiogram from 2020 with LV systolic function mildly reduced to 45 to 50%, global hypokinesis with no regional wall motion abnormalities, preserved RV systolic function. RA with moderate dilatation,  no significant valvular disease.   Because of recurrent symptoms, patient may need repeat stress test, possible as outpatient, will follow up with cardiology recommendations.   Will do a trial of antiacid therapy, with pantoprazole and sucralfate.   Sinus bradycardia by electrocardiogram Patient has a home cardiac monitor in place.  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  Continue telemetry monitoring.   Mixed hyperlipidemia  Mild elevation of cholesterol,  Patient is not on statin therapy.   GAD (generalized anxiety disorder) Not on medical therapy, follow up as outpatient.   Asthma No signs of exacerbation.  He is not on bronchodilator therapy at home.   Obesity, class 1 Calculated BMI is 30,4     Advance Care Planning:   Code Status: Prior   Consults: cardiology   Family Communication: I spoke with patient's wife at the bedside, we talked in detail about patient's condition, plan of care and prognosis and all questions were addressed.   Severity of Illness: The appropriate patient status for this patient is OBSERVATION. Observation status is judged to be reasonable and necessary in order to provide the required intensity of service to ensure the patient's safety. The patient's presenting symptoms, physical exam findings, and initial radiographic and laboratory  data in the context of their medical condition is felt to place them at decreased risk for further clinical deterioration. Furthermore, it is anticipated that the patient will be medically stable for discharge from the hospital within 2 midnights of admission.   Author: Coralie Keens, MD 10/27/2023 8:23 AM  For on call review [email or web address].

## 2023-10-27 NOTE — Assessment & Plan Note (Signed)
 Mild elevation of cholesterol,  Patient is not on statin therapy.

## 2023-10-27 NOTE — ED Provider Notes (Signed)
 [facility] DEPT [facility] Emergency Department Provider Note MRN:  [id number]  Arrival date & time: 10/27/23     Chief Complaint   Chest Pain   History of Present Illness   Noah Clark is a 58 y.o. year-old male presents to the ED with chief complaint of chest pain.  States that he had some associated pain in the left arm.  Reports associated nausea.  Symptoms have been intermittent for the past few weeks.  States that tonight it got worse around 9pm at bedtime.  States that he woke up a few hours later and was still having pain.  States that he isn't having any pain now.  States that he has had pain beneath his left arm and chest, but tonight it moved to the front.  Has had some associated dizziness.  Marland Kitchen  History provided by patient.   Review of Systems  Pertinent positive and negative review of systems noted in HPI.    Physical Exam   Vitals:   10/27/23 0430 10/27/23 0445  BP: 127/69 118/63  Pulse: (!) 49 (!) 57  Resp: 18 17  Temp:    SpO2: 95% 96%    CONSTITUTIONAL:  non toxic-appearing, NAD NEURO:  Alert and oriented x 3, CN 3-12 grossly intact EYES:  eyes equal and reactive ENT/NECK:  Supple, no stridor  CARDIO:  bradycardic, regular rhythm, appears well-perfused  PULM:  No respiratory distress, CTAB GI/GU:  non-distended, non tender MSK/SPINE:  No gross deformities, no edema, moves all extremities  SKIN:  no rash, atraumatic   *Additional and/or pertinent findings included in MDM below  Diagnostic and Interventional Summary    EKG Interpretation Date/Time:  Saturday October 27 2023 01:42:22 EST Ventricular Rate:  47 PR Interval:  168 QRS Duration:  132 QT Interval:  446 QTC Calculation: 394 R Axis:   -51  Text Interpretation: Sinus bradycardia Left axis deviation Non-specific intra-ventricular conduction block Minimal voltage criteria for LVH, may be normal variant ( Cornell product ) Abnormal ECG When compared with ECG of 22-Oct-2023 15:48,  PREVIOUS ECG IS PRESENT Rate slower Confirmed by Glynn Octave [phone]) on 10/27/2023 2:49:48 AM       Labs Reviewed  BASIC METABOLIC PANEL - Abnormal; Notable for the following components:      Result Value   Glucose, Bld 118 (*)    All other components within normal limits  CBC  TROPONIN I (HIGH SENSITIVITY)  TROPONIN I (HIGH SENSITIVITY)    DG Chest 2 View  Final Result      Medications - No data to display   Procedures  /  Critical Care Procedures  ED Course and Medical Decision Making  I have reviewed the triage vital signs, the nursing notes, and pertinent available records from the EMR.  Social Determinants Affecting Complexity of Care: Patient has no clinically significant social determinants affecting this chief complaint..   ED Course:    Medical Decision Making Patient here with chest pain that started around 9 PM.  Lasted for several hours.  He has had increasingly frequent episodes of the same.  He has been following up with cardiology as well as PCP.  Has outpatient coronary CT, echocardiogram, and Doppler studies of carotids ordered.  States when his symptoms worsen, he became more concerned that he "may not make it to the studies."  He came to the emergency department for further evaluation.  Currently not having pain, but remains concerned about going home.  I will call and discussed with  cardiology.  Risk Decision regarding hospitalization.         Consultants: I consulted with Dr. Hyacinth Meeker, who recommends agrees with plan for obs admission for cardiac rule out.  Agrees with plan to get CT coronary.  Other upcoming studies potentially as well.  Echo, carotid dopplers.  I consulted with Dr. Antionette Char, who is appreciated for admitting.  Treatment and Plan: Patient's exam and diagnostic results are concerning for chest pain.  Feel that patient will need admission to the hospital for further treatment and evaluation.    Final Clinical  Impressions(s) / ED Diagnoses     ICD-10-CM   1. Chest pain, unspecified type  R07.9       ED Discharge Orders     None         Discharge Instructions Discussed with and Provided to Patient:   Discharge Instructions   None      Roxy Horseman, PA-C 10/27/23 [id number]    Glynn Octave, MD 10/27/23 [phone]

## 2023-10-27 NOTE — Progress Notes (Signed)
  Echocardiogram 2D Echocardiogram has been performed.  Delcie Roch 10/27/2023, 3:35 PM

## 2023-10-27 NOTE — ED Notes (Signed)
 Main lab to add-on creatinine, serum to previously collected specimens.

## 2023-10-27 NOTE — Consult Note (Signed)
 CARDIOLOGY CONSULT NOTE       Patient ID: Noah Clark MRN: [id number] DOB/AGE: 57-Sep-1967 57 y.o.  Admit date: 10/27/2023 Referring Physician: Marlin Canary Primary Physician: Langley Gauss, PA Primary Cardiologist: Revankar Reason for Consultation: Chest pain  Principal Problem:   Chest pain Active Problems:   Mixed hyperlipidemia   GAD (generalized anxiety disorder)   Sinus bradycardia by electrocardiogram   Asthma   Obesity, class 1   HPI:  58 y.o. admitted with atypical chest pain. Apparently IM spoke with fellow last night and thought cardiac CTA could be done today. We have no cardiac CT tech in radiology today and I am just finding out about patient. 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 Myovue with small apical scar no ischemia and cath never done. SSCP is atypical can be sharp not always exertional can radiate to left shoulder and arm. Was set up to have outpatient cardiac CTA 11/09/23.  CXR on admission NAD. No CHF symptoms Prior heavy ETOH now quit When drinking had some lightheadedness.   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 not on statin Rx  ROS All other systems reviewed and negative except as noted above  Past Medical History:  Diagnosis Date   Anxiety    Asthma    as a child   Bilateral impacted cerumen 10/27/2022   BMI 29.0-29.9,adult 09/23/2021   Cancer (HCC)    colon cancer   Colon cancer (HCC) 09/13/2017   Dyspnea on exertion 10/18/2023   Elevated blood pressure reading without diagnosis of hypertension 10/27/2022   Elevated fasting blood sugar 10/18/2023   GAD (generalized anxiety disorder) 10/27/2022   Heart murmur    Immunization due 04/30/2023   Intermittent lightheadedness 10/18/2023   Mixed dyslipidemia 02/12/2019   Mixed hyperlipidemia 02/12/2019   Other fatigue 10/18/2023   Screening for viral disease 04/30/2023   Sinus bradycardia by electrocardiogram 10/18/2023     Family History  Problem Relation Age of Onset   Hypertension Mother    Other Father        Car accident   Hypertension Father    Arthritis Father     Social History   Socioeconomic History   Marital status: Married    Spouse name: Not on file   Number of children: 2   Years of education: Not on file   Highest education level: Not on file  Occupational History   Not on file  Tobacco Use   Smoking status: Never   Smokeless tobacco: Never  Vaping Use   Vaping status: Never Used  Substance and Sexual Activity   Alcohol use: Yes    Comment: weekends   Drug use: No   Sexual activity: Yes    Partners: Female  Other Topics Concern   Not on file  Social History Narrative   Not on file   Social Drivers of Health   Financial Resource Strain: Low Risk  (10/18/2023)   Overall Financial Resource Strain (CARDIA)    Difficulty of Paying Living Expenses: Not hard at all  Food Insecurity: No Food Insecurity (10/18/2023)   Hunger Vital Sign    Worried About Running Out of Food in the Last Year: Never true    Ran Out of Food in the Last Year: Never true  Transportation Needs: No Transportation Needs (10/18/2023)   PRAPARE - Administrator, Civil Service (Medical): No  Lack of Transportation (Non-Medical): No  Physical Activity: Sufficiently Active (10/18/2023)   Exercise Vital Sign    Days of Exercise per Week: 4 days    Minutes of Exercise per Session: 60 min  Stress: No Stress Concern Present (10/18/2023)   [employer] of Occupational Health - Occupational Stress Questionnaire    Feeling of Stress : Not at all  Social Connections: Moderately Integrated (10/18/2023)   Social Connection and Isolation Panel [NHANES]    Frequency of Communication with Friends and Family: More than three times a week    Frequency of Social Gatherings with Friends and Family: More than three times a week    Attends Religious Services: More than 4 times per year    Active Member of  Clubs or Organizations: No    Attends Banker Meetings: Never    Marital Status: Married  Catering manager Violence: Not At Risk (10/18/2023)   Humiliation, Afraid, Rape, and Kick questionnaire    Fear of Current or Ex-Partner: No    Emotionally Abused: No    Physically Abused: No    Sexually Abused: No    Past Surgical History:  Procedure Laterality Date   LAPAROSCOPIC PARTIAL COLECTOMY N/A 09/13/2017   Procedure: LAPAROSCOPIC SIGMOIDECTOMY COLECTOMY ERAS PATHWAY;  Surgeon: Romie Levee, MD;  Location: [facility];  Service: General;  Laterality: N/A;   VASECTOMY     WISDOM TOOTH EXTRACTION        Current Facility-Administered Medications:    acetaminophen (TYLENOL) tablet 650 mg, 650 mg, Oral, Q6H PRN **OR** acetaminophen (TYLENOL) suppository 650 mg, 650 mg, Rectal, Q6H PRN, Arrien, York Ram, MD   aspirin EC tablet 81 mg, 81 mg, Oral, QHS, Arrien, York Ram, MD   enoxaparin (LOVENOX) injection 40 mg, 40 mg, Subcutaneous, Q24H, Arrien, York Ram, MD, 40 mg at 10/27/23 0951   magnesium gluconate (MAGONATE) tablet 500 mg, 500 mg, Oral, QHS, Arrien, York Ram, MD   ondansetron [facility]) tablet 4 mg, 4 mg, Oral, Q6H PRN **OR** ondansetron (ZOFRAN) injection 4 mg, 4 mg, Intravenous, Q6H PRN, Arrien, York Ram, MD   pantoprazole (PROTONIX) EC tablet 40 mg, 40 mg, Oral, Daily, Arrien, York Ram, MD, 40 mg at 10/27/23 0950   sucralfate (CARAFATE) 1 GM/10ML suspension 1 g, 1 g, Oral, TID WC & HS, Arrien, York Ram, MD, 1 g at 10/27/23 1145  aspirin EC  81 mg Oral QHS   enoxaparin (LOVENOX) injection  40 mg Subcutaneous Q24H   magnesium gluconate  500 mg Oral QHS   pantoprazole  40 mg Oral Daily   sucralfate  1 g Oral TID WC & HS     Physical Exam: Blood pressure 124/63, pulse (!) 53, temperature 98.6 F (37 C), temperature source Oral, resp. rate 17, height 5\' 11"  (1.803 m), weight 99 kg, SpO2 97%.    Affect appropriate Healthy:   appears stated age HEENT: normal Neck supple with no adenopathy JVP normal no bruits no thyromegaly Lungs clear with no wheezing and good diaphragmatic motion Heart:  S1/S2 no murmur, no rub, gallop or click PMI normal Abdomen: benighn, BS positve, no tenderness, no AAA no bruit.  No HSM or HJR prior colon resection  Distal pulses intact with no bruits No edema Neuro non-focal Skin warm and dry No muscular weakness   Labs:   Lab Results  Component Value Date   WBC 7.2 10/27/2023   HGB 14.8 10/27/2023   HCT 42.8 10/27/2023   MCV 90.1 10/27/2023   PLT 280 10/27/2023  Recent Labs  Lab 10/26/23 0824 10/27/23 0208 10/27/23 0402  NA 140 137  --   K 5.2 4.0  --   CL 102 105  --   CO2 24 22  --   BUN 15 20  --   CREATININE 0.89 0.86 0.85  CALCIUM 9.8 9.1  --   PROT 6.7  --   --   BILITOT 0.6  --   --   ALKPHOS 68  --   --   ALT 23  --   --   AST 17  --   --   GLUCOSE 101* 118*  --    No results found for: "CKTOTAL", "CKMB", "CKMBINDEX", "TROPONINI"  Lab Results  Component Value Date   CHOL 209 (H) 10/26/2023   CHOL 216 (H) 04/27/2023   CHOL 164 10/27/2022   Lab Results  Component Value Date   HDL 67 10/26/2023   HDL 70 04/27/2023   HDL 55 10/27/2022   Lab Results  Component Value Date   LDLCALC 134 (H) 10/26/2023   LDLCALC 140 (H) 04/27/2023   LDLCALC 99 10/27/2022   Lab Results  Component Value Date   TRIG 47 10/26/2023   TRIG 37 04/27/2023   TRIG 48 10/27/2022   Lab Results  Component Value Date   CHOLHDL 3.1 10/26/2023   CHOLHDL 3.1 04/27/2023   CHOLHDL 3.0 10/27/2022   No results found for: "LDLDIRECT"    Radiology: DG Chest 2 View Result Date: 10/27/2023 CLINICAL DATA:  Chest pain EXAM: CHEST - 2 VIEW COMPARISON:  02/05/2019 FINDINGS: Lungs are clear.  No pleural effusion or pneumothorax. The heart is normal in size. Visualized osseous structures are within normal limits. IMPRESSION: Normal chest radiographs. Electronically Signed   By:  Charline Bills M.D.   On: 10/27/2023 02:27    EKG: SB normal    ASSESSMENT AND PLAN:   Chest pain:  atypical r/o ECG normal Prior abnormal myovue 2020 likely artifact. Prior TTE with EF 45-50% while drinking ? Non ischemic DCM. Patient is anxious about going home. Prefers to have testing done this weekend if possible We have a cardiac tech tomorrow Have written order for 18 g and test No need for beta blocker HR low 50's Discussed scan an using SL nitroglycerin  Dr Odis Hollingshead available tomorrow to read.  DCM:  have asked echo tech to do TTE today Hopefully now that he has stopped ETOH EF will be normal  HLD:  will see what calcium score looks like and probably start statin   Signed: Charlton Haws 10/27/2023, 2:34 PM

## 2023-10-27 NOTE — Plan of Care (Signed)

## 2023-10-27 NOTE — ED Notes (Signed)
 The pt is wearing a heart monitor  since this past monday

## 2023-10-28 ENCOUNTER — Observation Stay (HOSPITAL_BASED_OUTPATIENT_CLINIC_OR_DEPARTMENT_OTHER): Payer: 59

## 2023-10-28 DIAGNOSIS — J45909 Unspecified asthma, uncomplicated: Secondary | ICD-10-CM | POA: Diagnosis not present

## 2023-10-28 DIAGNOSIS — F411 Generalized anxiety disorder: Secondary | ICD-10-CM | POA: Diagnosis not present

## 2023-10-28 DIAGNOSIS — R072 Precordial pain: Secondary | ICD-10-CM

## 2023-10-28 DIAGNOSIS — E782 Mixed hyperlipidemia: Secondary | ICD-10-CM | POA: Diagnosis not present

## 2023-10-28 DIAGNOSIS — K219 Gastro-esophageal reflux disease without esophagitis: Secondary | ICD-10-CM | POA: Insufficient documentation

## 2023-10-28 DIAGNOSIS — R079 Chest pain, unspecified: Secondary | ICD-10-CM | POA: Diagnosis not present

## 2023-10-28 HISTORY — DX: Gastro-esophageal reflux disease without esophagitis: K21.9

## 2023-10-28 LAB — CBC
HCT: 42.4 % (ref 39.0–52.0)
Hemoglobin: 14.4 g/dL (ref 13.0–17.0)
MCH: 30.7 pg (ref 26.0–34.0)
MCHC: 34 g/dL (ref 30.0–36.0)
MCV: 90.4 fL (ref 80.0–100.0)
Platelets: 256 10*3/uL (ref 150–400)
RBC: 4.69 MIL/uL (ref 4.22–5.81)
RDW: 12.7 % (ref 11.5–15.5)
WBC: 6.7 10*3/uL (ref 4.0–10.5)
nRBC: 0 % (ref 0.0–0.2)

## 2023-10-28 LAB — BASIC METABOLIC PANEL
Anion gap: 10 (ref 5–15)
BUN: 13 mg/dL (ref 6–20)
CO2: 24 mmol/L (ref 22–32)
Calcium: 9.4 mg/dL (ref 8.9–10.3)
Chloride: 105 mmol/L (ref 98–111)
Creatinine, Ser: 1.06 mg/dL (ref 0.61–1.24)
GFR, Estimated: >60 mL/min (ref >60)
Glucose, Bld: 99 mg/dL (ref 70–99)
Potassium: 4.2 mmol/L (ref 3.5–5.1)
Sodium: 139 mmol/L (ref 135–145)

## 2023-10-28 MED ORDER — PANTOPRAZOLE SODIUM 40 MG PO TBEC: 40.0000 mg | DELAYED_RELEASE_TABLET | Freq: Every day | ORAL | 0 refills | Status: AC

## 2023-10-28 MED ORDER — IOHEXOL 350 MG/ML SOLN
95.0000 mL | Freq: Once | INTRAVENOUS | Status: AC | PRN
  Administered 2023-10-28: 95 mL via INTRAVENOUS

## 2023-10-28 NOTE — Progress Notes (Signed)
 Cardiologist:  Renenkar  Subjective:  Denies SSCP, palpitations or Dyspnea   Objective:  Vitals:   10/27/23 1739 10/27/23 1957 10/28/23 0014 10/28/23 0359  BP:  120/68 114/66 111/75  Pulse: (!) 50 (!) 52 (!) 57 (!) 50  Resp: 16 20 16 18   Temp: 98.5 F (36.9 C) 98.4 F (36.9 C) 98.3 F (36.8 C) 98.4 F (36.9 C)  TempSrc: Oral Oral Oral Oral  SpO2: 96% 97% 98% 96%  Weight:      Height:        Intake/Output from previous day:  Intake/Output Summary (Last 24 hours) at 10/28/2023 0851 Last data filed at 10/28/2023 1610 Gross per 24 hour  Intake 0 ml  Output --  Net 0 ml    Physical Exam: Affect appropriate Healthy:  appears stated age HEENT: normal Neck supple with no adenopathy JVP normal no bruits no thyromegaly Lungs clear with no wheezing and good diaphragmatic motion Heart:  S1/S2 no murmur, no rub, gallop or click PMI normal Abdomen: benighn, BS positve, no tenderness, no AAA no bruit.  No HSM or HJR Distal pulses intact with no bruits No edema Neuro non-focal Skin warm and dry No muscular weakness   Lab Results: Basic Metabolic Panel: Recent Labs    10/26/23 0825 10/27/23 0208 10/27/23 0402 10/28/23 0357  NA  --  137  --  139  K  --  4.0  --  4.2  CL  --  105  --  105  CO2  --  22  --  24  GLUCOSE  --  118*  --  99  BUN  --  20  --  13  CREATININE  --  0.86 0.85 1.06  CALCIUM  --  9.1  --  9.4  MG 2.3  --   --   --    Liver Function Tests: Recent Labs    10/26/23 0824  AST 17  ALT 23  ALKPHOS 68  BILITOT 0.6  PROT 6.7  ALBUMIN 4.7   No results for input(s): "LIPASE", "AMYLASE" in the last 72 hours. CBC: Recent Labs    10/27/23 0208 10/28/23 0357  WBC 7.2 6.7  HGB 14.8 14.4  HCT 42.8 42.4  MCV 90.1 90.4  PLT 280 256    Fasting Lipid Panel: Recent Labs    10/26/23 0825  CHOL 209*  HDL 67  LDLCALC 134*  TRIG 47  CHOLHDL 3.1   Thyroid Function Tests: Recent Labs    10/26/23 0825  T3FREE 3.2   Anemia  Panel: No results for input(s): "VITAMINB12", "FOLATE", "FERRITIN", "TIBC", "IRON", "RETICCTPCT" in the last 72 hours.  Imaging: ECHOCARDIOGRAM COMPLETE Result Date: 10/27/2023    ECHOCARDIOGRAM REPORT   Patient Name:   Noah Clark Date of Exam: 10/27/2023 Medical Rec #:  [id number]     Height:       71.0 in Accession #:    [id number]    Weight:       216.2 lb Date of Birth:  1966/07/26      BSA:          2.180 m Patient Age:    58 years      BP:           137/73 mmHg Patient Gender: M             HR:           50 bpm. Exam Location:  Inpatient Procedure: 2D Echo (Both Spectral and Color Flow  Doppler were utilized during            procedure). Indications:    chest pain  History:        Patient has prior history of Echocardiogram examinations, most                 recent 03/21/2019. Arrythmias:Bradycardia; Risk                 Factors:Dyslipidemia.  Sonographer:    Delcie Roch RDCS Referring Phys: [id number] MAURICIO DANIEL ARRIEN IMPRESSIONS  1. Left ventricular ejection fraction, by estimation, is 55 to 60%. The left ventricle has normal function. The left ventricle has no regional wall motion abnormalities. The left ventricular internal cavity size was dilated. Left ventricular diastolic parameters were normal.  2. Right ventricular systolic function is normal. The right ventricular size is normal.  3. The mitral valve is normal in structure. Trivial mitral valve regurgitation. No evidence of mitral stenosis.  4. The aortic valve is tricuspid. Aortic valve regurgitation is not visualized. No aortic stenosis is present. Comparison(s): A prior study was performed on 03/21/2019. Prior images unable to be directly viewed, comparison made by report only. LVEF improved from 45-50% to 55-60%. FINDINGS  Left Ventricle: Left ventricular ejection fraction, by estimation, is 55 to 60%. The left ventricle has normal function. The left ventricle has no regional wall motion abnormalities. Strain imaging was not  performed. The left ventricular internal cavity  size was dilated. There is no left ventricular hypertrophy. Left ventricular diastolic parameters were normal. Right Ventricle: The right ventricular size is normal. No increase in right ventricular wall thickness. Right ventricular systolic function is normal. Left Atrium: Left atrial size was normal in size. Right Atrium: Right atrial size was normal in size. Pericardium: The pericardium was not assessed. Mitral Valve: The mitral valve is normal in structure. Trivial mitral valve regurgitation. No evidence of mitral valve stenosis. Tricuspid Valve: The tricuspid valve is normal in structure. Tricuspid valve regurgitation is not demonstrated. No evidence of tricuspid stenosis. Aortic Valve: The aortic valve is tricuspid. Aortic valve regurgitation is not visualized. No aortic stenosis is present. Pulmonic Valve: The pulmonic valve was grossly normal. Pulmonic valve regurgitation is trivial. No evidence of pulmonic stenosis. Aorta: The aortic root and ascending aorta are structurally normal, with no evidence of dilitation. IAS/Shunts: The atrial septum is grossly normal. Additional Comments: 3D imaging was not performed.  LEFT VENTRICLE PLAX 2D LVIDd:         6.20 cm      Diastology LVIDs:         4.10 cm      LV e' medial:    10.80 cm/s LV PW:         1.10 cm      LV E/e' medial:  6.9 LV IVS:        0.80 cm      LV e' lateral:   13.60 cm/s LVOT diam:     2.20 cm      LV E/e' lateral: 5.5 LV SV:         79 LV SV Index:   36 LVOT Area:     3.80 cm  LV Volumes (MOD) LV vol d, MOD A4C: 123.0 ml LV vol s, MOD A4C: 60.2 ml LV SV MOD A4C:     123.0 ml RIGHT VENTRICLE             IVC RV Basal diam:  2.90 cm     IVC  diam: 1.70 cm RV S prime:     15.00 cm/s TAPSE (M-mode): 2.8 cm LEFT ATRIUM             Index        RIGHT ATRIUM           Index LA diam:        4.10 cm 1.88 cm/m   RA Area:     17.30 cm LA Vol (A2C):   57.7 ml 26.47 ml/m  RA Volume:   46.50 ml  21.33 ml/m  LA Vol (A4C):   63.1 ml 28.95 ml/m LA Biplane Vol: 64.1 ml 29.41 ml/m  AORTIC VALVE LVOT Vmax:   101.00 cm/s LVOT Vmean:  64.900 cm/s LVOT VTI:    0.207 m  AORTA Ao Root diam: 3.50 cm Ao Asc diam:  3.60 cm MITRAL VALVE MV Area (PHT): 2.71 cm    SHUNTS MV Decel Time: 280 msec    Systemic VTI:  0.21 m MV E velocity: 74.40 cm/s  Systemic Diam: 2.20 cm MV A velocity: 44.00 cm/s MV E/A ratio:  1.69 Sunit Tolia Electronically signed by Tessa Lerner Signature Date/Time: 10/27/2023/3:55:55 PM    Final    DG Chest 2 View Result Date: 10/27/2023 CLINICAL DATA:  Chest pain EXAM: CHEST - 2 VIEW COMPARISON:  02/05/2019 FINDINGS: Lungs are clear.  No pleural effusion or pneumothorax. The heart is normal in size. Visualized osseous structures are within normal limits. IMPRESSION: Normal chest radiographs. Electronically Signed   By: Charline Bills M.D.   On: 10/27/2023 02:27    Cardiac Studies:  ECG: SR no acute changes    Telemetry: SB rates low 50's   Echo: Normal EF 60-65% no RWMA;s  Medications:    aspirin EC  81 mg Oral QHS   enoxaparin (LOVENOX) injection  40 mg Subcutaneous Q24H   magnesium gluconate  500 mg Oral QHS   nitroGLYCERIN  0.8 mg Sublingual Once   pantoprazole  40 mg Oral Daily   sucralfate  1 g Oral TID WC & HS      Assessment/Plan:  Chest pain:  atypical r/o ECG normal Prior abnormal myovue 2020 likely artifact. Prior TTE with EF 45-50% while drinking ? Non ischemic DCM. TTE yesterday with normal EF. Arranged cardiac CTA this am Tech comes in at [employer] No need for beta blocker. SL nitroglycerin to  go with patient to scanner Has new 18 g left antecubital iv. Dr Odis Hollingshead to read. D/C home if no obstructive CAD  DCM:  EF now normal with no more ETOH  HLD:  will see what calcium score looks like and probably start statin  Charlton Haws 10/28/2023, 8:51 AM

## 2023-10-28 NOTE — Discharge Summary (Signed)
 Physician Discharge Summary  Noah Clark ZOX:[id number] DOB: 08/09/1966 DOA: 10/27/2023  PCP: Langley Gauss, PA  Admit date: 10/27/2023 Discharge date: 10/28/23  Admitted From: Home Disposition: Home Recommendations for Outpatient Follow-up:  Follow up with PCP in 1 to 2 weeks Check CMP and CBC at follow-up Please follow up on the following pending results: None  Home Health: No need identified Equipment/Devices: No need identified  Discharge Condition: Stable. CODE STATUS: Full code  Follow-up Information     Langley Gauss, [place]. Schedule an appointment as soon as possible for a visit in 1 week(s).   Specialty: Physician Assistant Contact information: [address] [place] [address] [place] [address] [phone]                 Hospital course 58 year old M with PMH of HFmrEF, asthma, colon cancer s/p resection, dyslipidemia and anxiety presented to ED with lower chest and upper epigastric, moderate to severe burning chest pain and lightheadedness when he lied down in bed.  Pain radiated to his arms and lasted about 3 hours and resolved in ED.  Patient has no exertional chest pain or significant dyspnea.  He does elliptical machines and weightlifting.  No history of diabetes, prior CAD or HTN. In 2020 he had cardiac work up with Lexiscan stress test with nuclear imaging, reported no reversible perfusion defects to suggest ischemia. Small scar in apical anteroseptal region described. LVEF 51%.   In ED, he was bradycardic to 46.  Other vital stable.  Basic labs without significant finding.  Serial troponin negative.  EKG showed sinus bradycardia with LAFB.  CXR without acute finding.  Cardiology consulted.  TTE and CT coronary study ordered.  Patient was admitted for overnight observation.  He was started on PPI and Carafate.  TTE with LVEF of 55 to 60%, no RWMA or diastolic dysfunction.  No evidence of valvular lesion either.  The next day, chest pain basically resolved.  LDL  134.  Patient was not interested in initiating statin until he talks to PCP.  CT coronary***.  He is cleared for discharge by cardiology***he is discharged on p.o. Protonix 40 mg daily*** In regards to sinus bradycardia, patient is not on nodal blocking agent.  He is recent thyroid panel was normal.  He has positive chronotropic effect to exertion with HR rising from 40s to 70s with sitting and standing from the bed few times.  Likely physiologic bradycardia.  See individual problem list below for more.   Problems addressed during this hospitalization Principal Problem:   Chest pain Active Problems:   Sinus bradycardia by electrocardiogram   Mixed hyperlipidemia   GAD (generalized anxiety disorder)   Asthma   Obesity, class 1   GERD (gastroesophageal reflux disease)              Time spent 35 minutes  Vital signs Vitals:   10/27/23 1957 10/28/23 0014 10/28/23 0359 10/28/23 0911  BP: 120/68 114/66 111/75 134/65  Pulse: (!) 52 (!) 57 (!) 50 (!) 59  Temp: 98.4 F (36.9 C) 98.3 F (36.8 C) 98.4 F (36.9 C) 98.6 F (37 C)  Resp: 20 16 18 18   Height:      Weight:      SpO2: 97% 98% 96% 99%  TempSrc: Oral Oral Oral Oral  BMI (Calculated):         Discharge exam  GENERAL: No apparent distress.  Nontoxic. HEENT: MMM.  Vision and hearing grossly intact.  NECK: Supple.  No apparent JVD.  RESP:  No IWOB.  Fair aeration bilaterally. CVS:  RRR. Heart sounds normal.  ABD/GI/GU: BS+. Abd soft, NTND.  MSK/EXT:  Moves extremities. No apparent deformity. No edema.  SKIN: no apparent skin lesion or wound NEURO: Awake and alert. Oriented appropriately.  No apparent focal neuro deficit. PSYCH: Calm. Normal affect.  Discharge Instructions Discharge Instructions     Diet general   Complete by: As directed    Discharge instructions   Complete by: As directed    It has been a pleasure taking care of you!  You were hospitalized due to chest pain which could be muscle pain or  reflux.  You tested have not shown evidence of chest pain coming from your lung or your heart.  However, your LDL cholesterol is slightly elevated.  We recommend you discuss this with your primary care doctor since you do not want [place] to start you on medication yet.  Follow-up with your primary care doctor in 1 to 2 weeks or sooner if needed.   Take care,   Increase activity slowly   Complete by: As directed       Allergies as of 10/28/2023   No Known Allergies      Medication List     TAKE these medications    aspirin EC 81 MG tablet Take 1 tablet (81 mg total) by mouth daily. Swallow whole. What changed: when to take this   MAGNESIUM GLYCINATE PO Take 1 tablet by mouth at bedtime.   pantoprazole 40 MG tablet Commonly known as: PROTONIX Take 1 tablet (40 mg total) by mouth daily. Start taking on: October 29, 2023   VITAMIN K2-VITAMIN D3 PO Take 5,000 mcg by mouth at bedtime.        Consultations: ***  Procedures/Studies: ***   ECHOCARDIOGRAM COMPLETE Result Date: 10/27/2023    ECHOCARDIOGRAM REPORT   Patient Name:   Noah Clark Date of Exam: 10/27/2023 Medical Rec #:  [id number]     Height:       71.0 in Accession #:    [id number]    Weight:       216.2 lb Date of Birth:  Mar 31, 1966      BSA:          2.180 m Patient Age:    58 years      BP:           137/73 mmHg Patient Gender: M             HR:           50 bpm. Exam Location:  Inpatient Procedure: 2D Echo (Both Spectral and Color Flow Doppler were utilized during            procedure). Indications:    chest pain  History:        Patient has prior history of Echocardiogram examinations, most                 recent 03/21/2019. Arrythmias:Bradycardia; Risk                 Factors:Dyslipidemia.  Sonographer:    Delcie Roch RDCS Referring Phys: [id number] MAURICIO DANIEL ARRIEN IMPRESSIONS  1. Left ventricular ejection fraction, by estimation, is 55 to 60%. The left ventricle has normal function. The left ventricle has  no regional wall motion abnormalities. The left ventricular internal cavity size was dilated. Left ventricular diastolic parameters were normal.  2. Right ventricular systolic function is normal. The right ventricular size is normal.  3. The mitral valve is normal in structure. Trivial mitral valve regurgitation. No evidence of mitral stenosis.  4. The aortic valve is tricuspid. Aortic valve regurgitation is not visualized. No aortic stenosis is present. Comparison(s): A prior study was performed on 03/21/2019. Prior images unable to be directly viewed, comparison made by report only. LVEF improved from 45-50% to 55-60%. FINDINGS  Left Ventricle: Left ventricular ejection fraction, by estimation, is 55 to 60%. The left ventricle has normal function. The left ventricle has no regional wall motion abnormalities. Strain imaging was not performed. The left ventricular internal cavity  size was dilated. There is no left ventricular hypertrophy. Left ventricular diastolic parameters were normal. Right Ventricle: The right ventricular size is normal. No increase in right ventricular wall thickness. Right ventricular systolic function is normal. Left Atrium: Left atrial size was normal in size. Right Atrium: Right atrial size was normal in size. Pericardium: The pericardium was not assessed. Mitral Valve: The mitral valve is normal in structure. Trivial mitral valve regurgitation. No evidence of mitral valve stenosis. Tricuspid Valve: The tricuspid valve is normal in structure. Tricuspid valve regurgitation is not demonstrated. No evidence of tricuspid stenosis. Aortic Valve: The aortic valve is tricuspid. Aortic valve regurgitation is not visualized. No aortic stenosis is present. Pulmonic Valve: The pulmonic valve was grossly normal. Pulmonic valve regurgitation is trivial. No evidence of pulmonic stenosis. Aorta: The aortic root and ascending aorta are structurally normal, with no evidence of dilitation. IAS/Shunts: The  atrial septum is grossly normal. Additional Comments: 3D imaging was not performed.  LEFT VENTRICLE PLAX 2D LVIDd:         6.20 cm      Diastology LVIDs:         4.10 cm      LV e' medial:    10.80 cm/s LV PW:         1.10 cm      LV E/e' medial:  6.9 LV IVS:        0.80 cm      LV e' lateral:   13.60 cm/s LVOT diam:     2.20 cm      LV E/e' lateral: 5.5 LV SV:         79 LV SV Index:   36 LVOT Area:     3.80 cm  LV Volumes (MOD) LV vol d, MOD A4C: 123.0 ml LV vol s, MOD A4C: 60.2 ml LV SV MOD A4C:     123.0 ml RIGHT VENTRICLE             IVC RV Basal diam:  2.90 cm     IVC diam: 1.70 cm RV S prime:     15.00 cm/s TAPSE (M-mode): 2.8 cm LEFT ATRIUM             Index        RIGHT ATRIUM           Index LA diam:        4.10 cm 1.88 cm/m   RA Area:     17.30 cm LA Vol (A2C):   57.7 ml 26.47 ml/m  RA Volume:   46.50 ml  21.33 ml/m LA Vol (A4C):   63.1 ml 28.95 ml/m LA Biplane Vol: 64.1 ml 29.41 ml/m  AORTIC VALVE LVOT Vmax:   101.00 cm/s LVOT Vmean:  64.900 cm/s LVOT VTI:    0.207 m  AORTA Ao Root diam: 3.50 cm Ao Asc diam:  3.60 cm MITRAL VALVE MV Area (  PHT): 2.71 cm    SHUNTS MV Decel Time: 280 msec    Systemic VTI:  0.21 m MV E velocity: 74.40 cm/s  Systemic Diam: 2.20 cm MV A velocity: 44.00 cm/s MV E/A ratio:  1.69 Sunit Tolia Electronically signed by Tessa Lerner Signature Date/Time: 10/27/2023/3:55:55 PM    Final    DG Chest 2 View Result Date: 10/27/2023 CLINICAL DATA:  Chest pain EXAM: CHEST - 2 VIEW COMPARISON:  02/05/2019 FINDINGS: Lungs are clear.  No pleural effusion or pneumothorax. The heart is normal in size. Visualized osseous structures are within normal limits. IMPRESSION: Normal chest radiographs. Electronically Signed   By: Charline Bills M.D.   On: 10/27/2023 02:27       The results of significant diagnostics from this hospitalization (including imaging, microbiology, ancillary and laboratory) are listed below for reference.     Microbiology: No results found for this or any  previous visit (from the past 240 hours).   Labs:  CBC: Recent Labs  Lab 10/27/23 0208 10/28/23 0357  WBC 7.2 6.7  HGB 14.8 14.4  HCT 42.8 42.4  MCV 90.1 90.4  PLT 280 256   BMP &GFR Recent Labs  Lab 10/26/23 0824 10/26/23 0825 10/27/23 0208 10/27/23 0402 10/28/23 0357  NA 140  --  137  --  139  K 5.2  --  4.0  --  4.2  CL 102  --  105  --  105  CO2 24  --  22  --  24  GLUCOSE 101*  --  118*  --  99  BUN 15  --  20  --  13  CREATININE 0.89  --  0.86 0.85 1.06  CALCIUM 9.8  --  9.1  --  9.4  MG  --  2.3  --   --   --    Estimated Creatinine Clearance: 91.8 mL/min (by C-G formula based on SCr of 1.06 mg/dL). Liver & Pancreas: Recent Labs  Lab 10/26/23 0824  AST 17  ALT 23  ALKPHOS 68  BILITOT 0.6  PROT 6.7  ALBUMIN 4.7   No results for input(s): "LIPASE", "AMYLASE" in the last 168 hours. No results for input(s): "AMMONIA" in the last 168 hours. Diabetic: No results for input(s): "HGBA1C" in the last 72 hours. No results for input(s): "GLUCAP" in the last 168 hours. Cardiac Enzymes: No results for input(s): "CKTOTAL", "CKMB", "CKMBINDEX", "TROPONINI" in the last 168 hours. Recent Labs    10/26/23 0825  PROBNP <36   Coagulation Profile: No results for input(s): "INR", "PROTIME" in the last 168 hours. Thyroid Function Tests: Recent Labs    10/26/23 0825  FREET4 1.29  T3FREE 3.2   Lipid Profile: Recent Labs    10/26/23 0825  CHOL 209*  HDL 67  LDLCALC 134*  TRIG 47  CHOLHDL 3.1   Anemia Panel: No results for input(s): "VITAMINB12", "FOLATE", "FERRITIN", "TIBC", "IRON", "RETICCTPCT" in the last 72 hours. Urine analysis: No results found for: "COLORURINE", "APPEARANCEUR", "LABSPEC", "PHURINE", "GLUCOSEU", "HGBUR", "BILIRUBINUR", "KETONESUR", "PROTEINUR", "UROBILINOGEN", "NITRITE", "LEUKOCYTESUR" Sepsis Labs: Invalid input(s): "PROCALCITONIN", "LACTICIDVEN"   SIGNED:  Almon Hercules, MD  [facility] 10/28/2023, 1:28 PM

## 2023-10-28 NOTE — Plan of Care (Signed)

## 2023-10-29 ENCOUNTER — Other Ambulatory Visit: Payer: Self-pay | Admitting: Cardiovascular Disease

## 2023-10-29 ENCOUNTER — Encounter: Payer: Self-pay | Admitting: Physician Assistant

## 2023-10-29 ENCOUNTER — Telehealth: Payer: Self-pay

## 2023-10-29 NOTE — Telephone Encounter (Signed)
 Patient called stating he was recently in the hospital and had an echo and CT scan done.  He wanted to make [place] aware so Dr. Vincent Gros can take a look at it and see if  the carotid ultrasound that is scheduled for 11/13/23 is still necessary.

## 2023-10-29 NOTE — Telephone Encounter (Signed)
 Spoke with pt and advised he still needs the carotid duplex as scheduled. Pt states that he would like Dr. Vincent Gros to review his CT scan that was done at [facility]. Please advise

## 2023-10-30 DIAGNOSIS — Q2112 Patent foramen ovale: Secondary | ICD-10-CM

## 2023-10-30 HISTORY — DX: Patent foramen ovale: Q21.12

## 2023-10-30 NOTE — Telephone Encounter (Signed)
 Recommendations reviewed with pt as per Dr. Madireddy's note.  Pt verbalized understanding and had no additional questions.

## 2023-11-07 ENCOUNTER — Other Ambulatory Visit: Payer: 59

## 2023-11-09 ENCOUNTER — Ambulatory Visit (HOSPITAL_COMMUNITY): Payer: 59

## 2023-11-13 ENCOUNTER — Ambulatory Visit: Payer: 59

## 2023-11-13 ENCOUNTER — Other Ambulatory Visit: Payer: 59

## 2023-11-13 DIAGNOSIS — R42 Dizziness and giddiness: Secondary | ICD-10-CM

## 2023-11-13 DIAGNOSIS — I6523 Occlusion and stenosis of bilateral carotid arteries: Secondary | ICD-10-CM | POA: Diagnosis not present

## 2023-11-18 ENCOUNTER — Encounter: Payer: Self-pay | Admitting: Physician Assistant

## 2023-11-23 ENCOUNTER — Ambulatory Visit: Payer: 59

## 2023-11-23 VITALS — BP 138/84 | HR 46 | Ht 71.0 in | Wt 213.8 lb

## 2023-11-23 DIAGNOSIS — R030 Elevated blood-pressure reading, without diagnosis of hypertension: Secondary | ICD-10-CM | POA: Diagnosis not present

## 2023-11-23 DIAGNOSIS — E782 Mixed hyperlipidemia: Secondary | ICD-10-CM

## 2023-11-23 DIAGNOSIS — Q2112 Patent foramen ovale: Secondary | ICD-10-CM

## 2023-11-23 NOTE — Assessment & Plan Note (Signed)
 Advised to keep a log of blood pressure readings at home for couple weeks.  If consistently above 130/80 mmHg, will consider getting started on antihypertensive medication such as lisinopril. He will let us know of the readings in 1 to 2 weeks.  He can reach out to [place] or to his PCP in this regard.

## 2023-11-23 NOTE — Patient Instructions (Signed)
 Medication Instructions:  Your physician recommends that you continue on your current medications as directed. Please refer to the Current Medication list given to you today.  *If you need a refill on your cardiac medications before your next appointment, please call your pharmacy*   Lab Work: None ordered If you have labs (blood work) drawn today and your tests are completely normal, you will receive your results only by: MyChart Message (if you have MyChart) OR A paper copy in the mail If you have any lab test that is abnormal or we need to change your treatment, we will call you to review the results.   Testing/Procedures: None ordered   Follow-Up: At [facility], you and your health needs are our priority.  As part of our continuing mission to provide you with exceptional heart care, we have created designated Provider Care Teams.  These Care Teams include your primary Cardiologist (physician) and Advanced Practice Providers (APPs -  Physician Assistants and Nurse Practitioners) who all work together to provide you with the care you need, when you need it.  We recommend signing up for the patient portal called "MyChart".  Sign up information is provided on this After Visit Summary.  MyChart is used to connect with patients for Virtual Visits (Telemedicine).  Patients are able to view lab/test results, encounter notes, upcoming appointments, etc.  Non-urgent messages can be sent to your provider as well.   To learn more about what you can do with MyChart, go to [email or web address].    Your next appointment:   FU as needed  The format for your next appointment:   In Person  Provider:   Huntley Dec, MD    Other Instructions Keep a  BP log  Important Information About Sugar

## 2023-11-23 NOTE — Assessment & Plan Note (Signed)
 No coronary atherosclerosis on recent cardiac CT February 2025. Mildly elevated LDL 134 on 10-26-2023.  HDL 67, total cholesterol 469.  Triglycerides 47.  Hemoglobin A1c 5.6. In this context recommended dietary modifications.  Continue to follow-up with PCP.  Reasonable to hold off on statin therapy at this time.

## 2023-11-23 NOTE — Progress Notes (Signed)
 Cardiology Consultation:    Date:  11/23/2023   ID:  Noah Clark, DOB May 09, 1966, MRN [id number]  PCP:  Langley Gauss, PA  Cardiologist:  Marlyn Corporal Juma Oxley, MD   Referring MD: Langley Gauss, PA   No chief complaint on file.    ASSESSMENT AND PLAN:   Mr Noah Clark 58 year old male with no significant abnormalities on cardiac CT coronary angiogram, no plaque volume, CAD RADS 0 study February 2025, echocardiogram with normal biventricular function and no significant valve abnormalities, no major abnormalities on ultrasound carotids, event monitor unremarkable, also has history of colon cancer s/p resection, hyperlipidemia, prior moderate to heavy alcohol consumption now quit for past 3 months.  Here for follow-up visit.  No significant cardiac symptoms has nonspecific headache symptoms.  Problem List Items Addressed This Visit     Mixed hyperlipidemia   No coronary atherosclerosis on recent cardiac CT February 2025. Mildly elevated LDL 134 on 10-26-2023.  HDL 67, total cholesterol 782.  Triglycerides 47.  Hemoglobin A1c 5.6. In this context recommended dietary modifications.  Continue to follow-up with PCP.  Reasonable to hold off on statin therapy at this time.      Elevated blood pressure reading in office without diagnosis of hypertension   Advised to keep a log of blood pressure readings at home for couple weeks.  If consistently above 130/80 mmHg, will consider getting started on antihypertensive medication such as lisinopril. He will let us know of the readings in 1 to 2 weeks.  He can reach out to [place] or to his PCP in this regard.      PFO (patent foramen ovale), identified on CT coronary angiogram study February 2025 - Primary   Reviewed findings of PFO on his cardiac CT. Discussed benign nature of this. He has never had a stroke. Reassured him about the findings.       Return to clinic as needed.  History of Present Illness:    Noah Clark is a 58 y.o. male who is  being seen today for  follow up visit. Last visit in office with me was 10/22/2023.   PCP is Langley Gauss, [place].  Pleasant man.  Drives a dump truck.  Good baseline functional capacity and walks/runs regularly up to 35 miles a day and weight training.  Reportedly had mild cardiomyopathy EF 45 to 50% by echocardiogram back in July 2020 and stress test with nuclear imaging at the time showed no reversible perfusion defects to suggest ischemia.  Has history of hyperlipidemia and moderate to heavy alcohol consumption history [now quit for over 100 days] history of colon cancer s/p resection.  Was evaluated for symptoms of lightheadedness, palpitations, tiredness for the past few months.  Symptoms worse on the days he consumes alcohol and hence cut down on his alcohol consumption over the past 4 months.  After initial office visit he was scheduled for an outpatient follow-up with echocardiogram and cardiac CT.  PCP had already set up for heart monitor.  However on 10-27-2023 with episodes of chest pain he went to the ER and was admitted.  Troponins were unremarkable.  He further underwent workup with echocardiogram and cardiac CT patient.  His heart monitor and ultrasound carotid results are also now available. All his testing was reviewed and shows relatively normal and benign findings as below  13-day heart monitor from 10-18-2023 reported average heart rate 59/min [ranging from 33 bpm to 164 bpm].  Rare ventricular and supraventricular ectopy burden, less than 1%,  no prolonged pauses.  7 total triggers and 7 diary entries mostly correlated with sinus rhythm with heart rate ranging between 55 to 80 bpm.  Review of tracings myself.  Slowest heart rate of 33 bpm with sinus bradycardia noted during sleeping hours at 5:22 AM  Ultrasound carotids from 11-13-2023 noted less than 39% stenosis of bilateral internal carotids.  Bilateral vertebral artery flow was antegrade.  No significant subclavian hemodynamic flow  abnormalities.  CT coronary angiogram from 10-28-2023 with calcium score 0, total plaque volume 0, CAD RADS 0 study without evidence of atherosclerosis.  Small PFO reported.  Extracardiac findings reported small pulmonary nodules 4 mm in the left lung and 5 mm size in right middle lobe stable since 2019.  Considered benign.  Echocardiogram from 10-27-2023 noted normal biventricular function LVEF 55 to 60% without significant wall motion abnormalities.  Trace mitral regurgitation.  In comparison to previous echocardiogram report from March 21, 2019 this is an improvement from prior 45 to 50%.  Reviewed images myself.  Reviewed all these test results with him at length. Overall doing well.  No active symptoms of chest pain or shortness of breath.  He continues to have mild symptoms of headache with vision changes [floaters and flashes].  No syncopal or near syncopal episodes.  No dizziness.  Continues to exercise regularly.  Denies any significant palpitations.   Past Medical History:  Diagnosis Date   Anxiety    Asthma    as a child   Bilateral impacted cerumen 10/27/2022   BMI 29.0-29.9,adult 09/23/2021   Cancer (HCC)    colon cancer   Cardiomyopathy (HCC) 10/22/2023   Chest pain 10/27/2023   Chest pain of uncertain etiology 02/12/2019   Colon cancer (HCC) 09/13/2017   Dyspnea on exertion 10/18/2023   Elevated blood pressure reading without diagnosis of hypertension 10/27/2022   Elevated fasting blood sugar 10/18/2023   Encounter for physical examination 10/27/2022   GAD (generalized anxiety disorder) 10/27/2022   GERD (gastroesophageal reflux disease) 10/28/2023   Heart murmur    Immunization due 04/30/2023   Intermittent lightheadedness 10/18/2023   Mixed dyslipidemia 02/12/2019   Mixed hyperlipidemia 02/12/2019   Obesity, class 1 10/27/2023   Other fatigue 10/18/2023   PFO (patent foramen ovale), identified on CT coronary angiogram study February 2025 10/30/2023   Screening for  viral disease 04/30/2023   Sinus bradycardia by electrocardiogram 10/18/2023   Tinnitus aurium, bilateral 10/26/2023   Vitamin D deficiency 10/26/2023    Past Surgical History:  Procedure Laterality Date   LAPAROSCOPIC PARTIAL COLECTOMY N/A 09/13/2017   Procedure: LAPAROSCOPIC SIGMOIDECTOMY COLECTOMY ERAS PATHWAY;  Surgeon: Romie Levee, MD;  Location: [facility];  Service: General;  Laterality: N/A;   VASECTOMY     WISDOM TOOTH EXTRACTION      Current Medications: Current Meds  Medication Sig   aspirin EC 81 MG tablet Take 1 tablet (81 mg total) by mouth daily. Swallow whole.   MAGNESIUM GLYCINATE PO Take 1 tablet by mouth at bedtime.   pantoprazole (PROTONIX) 40 MG tablet Take 1 tablet (40 mg total) by mouth daily.   Vitamin D-Vitamin K (VITAMIN K2-VITAMIN D3 PO) Take 5,000 mcg by mouth at bedtime.     Allergies:   Patient has no known allergies.   Social History   Socioeconomic History   Marital status: Married    Spouse name: Not on file   Number of children: 2   Years of education: Not on file   Highest education level: Not on file  Occupational History   Not on file  Tobacco Use   Smoking status: Never   Smokeless tobacco: Never  Vaping Use   Vaping status: Never Used  Substance and Sexual Activity   Alcohol use: Yes    Comment: weekends   Drug use: No   Sexual activity: Yes    Partners: Female  Other Topics Concern   Not on file  Social History Narrative   Not on file   Social Drivers of Health   Financial Resource Strain: Low Risk  (10/18/2023)   Overall Financial Resource Strain (CARDIA)    Difficulty of Paying Living Expenses: Not hard at all  Food Insecurity: No Food Insecurity (10/27/2023)   Hunger Vital Sign    Worried About Running Out of Food in the Last Year: Never true    Ran Out of Food in the Last Year: Never true  Transportation Needs: No Transportation Needs (10/27/2023)   PRAPARE - Administrator, Civil Service (Medical): No     Lack of Transportation (Non-Medical): No  Physical Activity: Sufficiently Active (10/18/2023)   Exercise Vital Sign    Days of Exercise per Week: 4 days    Minutes of Exercise per Session: 60 min  Stress: No Stress Concern Present (10/18/2023)   [employer] of Occupational Health - Occupational Stress Questionnaire    Feeling of Stress : Not at all  Social Connections: Moderately Integrated (10/27/2023)   Social Connection and Isolation Panel [NHANES]    Frequency of Communication with Friends and Family: More than three times a week    Frequency of Social Gatherings with Friends and Family: More than three times a week    Attends Religious Services: More than 4 times per year    Active Member of [employer] or Organizations: No    Attends Banker Meetings: Never    Marital Status: Married     Family History: The patient's family history includes Arthritis in his father; Hypertension in his father and mother; Other in his father. ROS:   Please see the history of present illness.    All 14 point review of systems negative except as described per history of present illness.  EKGs/Labs/Other Studies Reviewed:    The following studies were reviewed today:   EKG:       Recent Labs: 10/18/2023: TSH 2.130 10/26/2023: ALT 23; Magnesium 2.3; NT-Pro BNP <36 10/28/2023: BUN 13; Creatinine, Ser 1.06; Hemoglobin 14.4; Platelets 256; Potassium 4.2; Sodium 139  Recent Lipid Panel    Component Value Date/Time   CHOL 209 (H) 10/26/2023 0825   TRIG 47 10/26/2023 0825   HDL 67 10/26/2023 0825   CHOLHDL 3.1 10/26/2023 0825   LDLCALC 134 (H) 10/26/2023 0825    Physical Exam:    VS:  BP 138/84   Pulse (!) 46   Ht 5\' 11"  (1.803 m)   Wt 213 lb 12.8 oz (97 kg)   SpO2 99%   BMI 29.82 kg/m     Wt Readings from Last 3 Encounters:  11/23/23 213 lb 12.8 oz (97 kg)  10/27/23 216 lb 3.2 oz (98.1 kg)  10/26/23 218 lb 3.2 oz (99 kg)     GENERAL:  Well nourished, well developed in no  acute distress NECK: No JVD; No carotid bruits CARDIAC: RRR, S1 and S2 present, no murmurs, no rubs, no gallops CHEST:  Clear to auscultation without rales, wheezing or rhonchi  Extremities: No pitting pedal edema. Pulses bilaterally symmetric with radial 2+ and dorsalis  pedis 2+ NEUROLOGIC:  Alert and oriented x 3  Medication Adjustments/Labs and Tests Ordered: Current medicines are reviewed at length with the patient today.  Concerns regarding medicines are outlined above.  No orders of the defined types were placed in this encounter.  No orders of the defined types were placed in this encounter.   Signed, Cecille Amsterdam, MD, MPH, [facility]. 11/23/2023 8:46 AM    [facility]

## 2023-11-23 NOTE — Assessment & Plan Note (Signed)
 Reviewed findings of PFO on his cardiac CT. Discussed benign nature of this. He has never had a stroke. Reassured him about the findings.

## 2023-11-30 ENCOUNTER — Ambulatory Visit: Payer: 59 | Attending: Physician Assistant | Admitting: Audiologist

## 2023-11-30 DIAGNOSIS — H9313 Tinnitus, bilateral: Secondary | ICD-10-CM | POA: Diagnosis present

## 2023-11-30 DIAGNOSIS — H903 Sensorineural hearing loss, bilateral: Secondary | ICD-10-CM | POA: Diagnosis present

## 2023-11-30 NOTE — Procedures (Signed)
  [facility] [address] [place], [place]  [address] [phone]  AUDIOLOGICAL  EVALUATION  NAME: Theon Sobotka Fitch     DOB:   1966-03-03      MRN: [id number]                                                                                     DATE: 11/30/2023     REFERENT: Langley Gauss, PA STATUS: Outpatient DIAGNOSIS: Sensorineural Hearing Loss, Tinnitus    History: Gorden was seen for an audiological evaluation due to tinnitus and difficulty hearing people clearly. He has had ringing for years. The gets worse when he is sick. He has a strange feeling in his head when he gets out of the truck. He denies any dizziness.  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  Medical history shows no additional risk for hearing loss.   Evaluation:  Otoscopy showed a clear view of the tympanic membranes, bilaterally Tympanometry results were consistent with normal middle ear function, bilaterally   Audiometric testing was completed using Conventional Audiometry techniques with insert earphones and supraural headphones. Test results are consistent with normal hearing sloping after 3kHz to a moderately severe sensorineural hearing loss bilaterally. Speech Recognition Thresholds were obtained at 15dB HL in the right ear and at 15dB HL in the left ear. Word Recognition Testing was completed at  40dB SL and Avishai scored 96-100% in each ear.    Results:  The test results were reviewed with Dorinda Hill. He has a sloping sensorineural  hearing loss in the highest pitches. This is the cause of his ringing. He is a hearing aid candidate if he feels he would wear them regularly.  Audiogram printed and provided to [employer].  Recommendations: Hearing aids recommended for both ears to help with tinnitus. Ares does not feel tinnitus is severe enough that he would be motivated to purse hearing aids. Patient given  list of local hearing aid providers.  Bi annual audiometric testing recommended to monitor hearing loss for progression.  Continue use of sound machine at night to mask tinnitus Get custom molded hearing protection due to significant noise exposure history   40 minutes spent testing and counseling on results.   If you have any questions please feel free to contact me at [phone].  Ammie Ferrier Stalnaker Au.D.  Audiologist   11/30/2023  10:51 AM  Cc: Langley Gauss, PA

## 2023-12-21 ENCOUNTER — Ambulatory Visit: Payer: 59 | Admitting: Physician Assistant

## 2024-06-22 ENCOUNTER — Ambulatory Visit (HOSPITAL_BASED_OUTPATIENT_CLINIC_OR_DEPARTMENT_OTHER)
Admission: EM | Admit: 2024-06-22 | Discharge: 2024-06-22 | Disposition: A | Discharge status: Home / Self Care | Attending: Family Medicine | Admitting: Family Medicine

## 2024-06-22 ENCOUNTER — Encounter (HOSPITAL_BASED_OUTPATIENT_CLINIC_OR_DEPARTMENT_OTHER): Payer: Self-pay | Admitting: Emergency Medicine

## 2024-06-22 DIAGNOSIS — J069 Acute upper respiratory infection, unspecified: Secondary | ICD-10-CM

## 2024-06-22 DIAGNOSIS — H66002 Acute suppurative otitis media without spontaneous rupture of ear drum, left ear: Secondary | ICD-10-CM

## 2024-06-22 DIAGNOSIS — H9201 Otalgia, right ear: Secondary | ICD-10-CM

## 2024-06-22 DIAGNOSIS — J3489 Other specified disorders of nose and nasal sinuses: Secondary | ICD-10-CM | POA: Diagnosis not present

## 2024-06-22 MED ORDER — CEFDINIR 300 MG PO CAPS: 300.0000 mg | ORAL_CAPSULE | Freq: Two times a day (BID) | ORAL | 0 refills | Status: AC

## 2024-06-22 MED ORDER — CEFDINIR 300 MG PO CAPS: 300.0000 mg | ORAL_CAPSULE | Freq: Two times a day (BID) | ORAL | 0 refills | Status: DC

## 2024-06-22 MED ORDER — TRIAMCINOLONE ACETONIDE 40 MG/ML IJ SUSP
40.0000 mg | Freq: Once | INTRAMUSCULAR | Status: AC
  Administered 2024-06-22: 40 mg via INTRAMUSCULAR

## 2024-06-22 NOTE — ED Provider Notes (Signed)
 [name]    CSN: [id number] Arrival date & time: 06/22/24  1129      History   Chief Complaint Chief Complaint  Patient presents with   Otalgia   Sore Throat    HPI Noah Clark is a 58 y.o. male.   58 year old male with complaint of chronic runny nose, sinus pressure, sore throat and ear pressure for 2 or 3 years.  He reports the symptoms come and go.  In early September 2025 had significant sore throat and he was put on Augmentin by primary care.  He is having worsening nasal congestion, right ear pain and cough feels like he might have an ear infection or sinus infection or both.   Otalgia Associated symptoms: congestion, cough and rhinorrhea   Associated symptoms: no abdominal pain, no diarrhea, no fever, no rash, no sore throat and no vomiting   Sore Throat Pertinent negatives include no chest pain and no abdominal pain.    Past Medical History:  Diagnosis Date   Anxiety    Asthma    as a child   Bilateral impacted cerumen 10/27/2022   BMI 29.0-29.9,adult 09/23/2021   Cancer (HCC)    colon cancer   Cardiomyopathy (HCC) 10/22/2023   Chest pain 10/27/2023   Chest pain of uncertain etiology 02/12/2019   Colon cancer (HCC) 09/13/2017   Dyspnea on exertion 10/18/2023   Elevated blood pressure reading without diagnosis of hypertension 10/27/2022   Elevated fasting blood sugar 10/18/2023   Encounter for physical examination 10/27/2022   GAD (generalized anxiety disorder) 10/27/2022   GERD (gastroesophageal reflux disease) 10/28/2023   Heart murmur    Immunization due 04/30/2023   Intermittent lightheadedness 10/18/2023   Mixed dyslipidemia 02/12/2019   Mixed hyperlipidemia 02/12/2019   Obesity, class 1 10/27/2023   Other fatigue 10/18/2023   PFO (patent foramen ovale), identified on CT coronary angiogram study February 2025 10/30/2023   Screening for viral disease 04/30/2023   Sinus bradycardia by electrocardiogram 10/18/2023   Tinnitus aurium,  bilateral 10/26/2023   Vitamin D  deficiency 10/26/2023    Patient Active Problem List   Diagnosis Date Noted   PFO (patent foramen ovale), identified on CT coronary angiogram study February 2025 10/30/2023   GERD (gastroesophageal reflux disease) 10/28/2023   Chest pain 10/27/2023   Obesity, class 1 10/27/2023   Tinnitus aurium, bilateral 10/26/2023   Vitamin D  deficiency 10/26/2023   Cardiomyopathy (HCC) 10/22/2023   Anxiety    Asthma    Cancer (HCC)    Heart murmur    Intermittent lightheadedness 10/18/2023   Other fatigue 10/18/2023   Elevated fasting blood sugar 10/18/2023   Sinus bradycardia by electrocardiogram 10/18/2023   Dyspnea on exertion 10/18/2023   Immunization due 04/30/2023   Screening for viral disease 04/30/2023   Encounter for physical examination 10/27/2022   Elevated blood pressure reading in office without diagnosis of hypertension 10/27/2022   Bilateral impacted cerumen 10/27/2022   GAD (generalized anxiety disorder) 10/27/2022   BMI 29.0-29.9,adult 09/23/2021   Chest pain of uncertain etiology 02/12/2019   Mixed hyperlipidemia 02/12/2019   Colon cancer (HCC) 09/13/2017    Past Surgical History:  Procedure Laterality Date   LAPAROSCOPIC PARTIAL COLECTOMY N/A 09/13/2017   Procedure: LAPAROSCOPIC SIGMOIDECTOMY COLECTOMY ERAS PATHWAY;  Surgeon: Debby Hila, MD;  Location: [facility];  Service: General;  Laterality: N/A;   VASECTOMY     WISDOM TOOTH EXTRACTION         Home Medications    Prior to Admission medications  Medication Sig Start Date End Date Taking? Authorizing Provider  cefdinir (OMNICEF) 300 MG capsule Take 1 capsule (300 mg total) by mouth 2 (two) times daily for 10 days. 06/22/24 07/02/24 Yes Ival Domino, FNP  aspirin  EC 81 MG tablet Take 1 tablet (81 mg total) by mouth daily. Swallow whole. 10/22/23   Madireddy, Alean SAUNDERS, MD  MAGNESIUM  GLYCINATE PO Take 1 tablet by mouth at bedtime.    [provider]  pantoprazole   (PROTONIX ) 40 MG tablet Take 1 tablet (40 mg total) by mouth daily. 10/29/23   Gonfa, Taye T, MD  Vitamin D -Vitamin K (VITAMIN K2-VITAMIN D3 PO) Take 5,000 mcg by mouth at bedtime.    [provider]    Family History Family History  Problem Relation Age of Onset   Hypertension Mother    Other Father        Car accident   Hypertension Father    Arthritis Father     Social History Social History   Tobacco Use   Smoking status: Never   Smokeless tobacco: Never  Vaping Use   Vaping status: Never Used  Substance Use Topics   Alcohol use: Yes    Comment: weekends   Drug use: No     Allergies   Patient has no known allergies.   Review of Systems Review of Systems  Constitutional:  Negative for chills and fever.  HENT:  Positive for congestion, ear pain, postnasal drip, rhinorrhea, sinus pressure and sinus pain. Negative for sore throat.   Eyes:  Negative for pain and visual disturbance.  Respiratory:  Positive for cough.   Cardiovascular:  Negative for chest pain and palpitations.  Gastrointestinal:  Negative for abdominal pain, constipation, diarrhea, nausea and vomiting.  Genitourinary:  Negative for dysuria and hematuria.  Musculoskeletal:  Negative for arthralgias and back pain.  Skin:  Negative for color change and rash.  Neurological:  Negative for seizures and syncope.  All other systems reviewed and are negative.    Physical Exam Triage Vital Signs ED Triage Vitals  Encounter Vitals Group     BP 06/22/24 1242 (!) 155/96     Girls Systolic BP Percentile --      Girls Diastolic BP Percentile --      Boys Systolic BP Percentile --      Boys Diastolic BP Percentile --      Pulse Rate 06/22/24 1242 (!) 56     Resp 06/22/24 1242 18     Temp 06/22/24 1242 98.4 F (36.9 C)     Temp Source 06/22/24 1242 Oral     SpO2 06/22/24 1242 97 %     Weight --      Height --      Head Circumference --      Peak Flow --      Pain Score 06/22/24 1240 0      Pain Loc --      Pain Education --      Exclude from Growth Chart --    No data found.  Updated Vital Signs BP (!) 155/96 (BP Location: Right Arm)   Pulse (!) 56   Temp 98.4 F (36.9 C) (Oral)   Resp 18   SpO2 97%   Visual Acuity Right Eye Distance:   Left Eye Distance:   Bilateral Distance:    Right Eye Near:   Left Eye Near:    Bilateral Near:     Physical Exam Vitals and nursing note reviewed.  Constitutional:  General: He is not in acute distress.    Appearance: He is well-developed. He is not ill-appearing, toxic-appearing or diaphoretic.  HENT:     Head: Normocephalic and atraumatic.     Right Ear: Hearing, tympanic membrane, ear canal and external ear normal.     Left Ear: Hearing, ear canal and external ear normal. A middle ear effusion is present. Tympanic membrane is erythematous. Tympanic membrane is not bulging.     Nose: Congestion and rhinorrhea present.     Right Sinus: Maxillary sinus tenderness and frontal sinus tenderness present.     Left Sinus: Maxillary sinus tenderness and frontal sinus tenderness present.     Mouth/Throat:     Lips: Pink.     Mouth: Mucous membranes are moist.     Pharynx: Uvula midline. Posterior oropharyngeal erythema present. No oropharyngeal exudate.     Tonsils: No tonsillar exudate.  Eyes:     Conjunctiva/sclera: Conjunctivae normal.     Pupils: Pupils are equal, round, and reactive to light.  Cardiovascular:     Rate and Rhythm: Normal rate and regular rhythm.     Heart sounds: S1 normal and S2 normal. No murmur heard. Pulmonary:     Effort: Pulmonary effort is normal. No respiratory distress.     Breath sounds: Normal breath sounds. No decreased breath sounds, wheezing, rhonchi or rales.  Abdominal:     General: Bowel sounds are normal.     Palpations: Abdomen is soft.     Tenderness: There is no abdominal tenderness.  Musculoskeletal:        General: No swelling.     Cervical back: Neck supple.   Lymphadenopathy:     Head:     Right side of head: Submental, submandibular and tonsillar adenopathy present. No preauricular or posterior auricular adenopathy.     Left side of head: Submental, submandibular and tonsillar adenopathy present. No preauricular or posterior auricular adenopathy.     Cervical: Cervical adenopathy present.     Right cervical: Superficial cervical adenopathy present.     Left cervical: Superficial cervical adenopathy present.  Skin:    General: Skin is warm and dry.     Capillary Refill: Capillary refill takes less than 2 seconds.     Findings: No rash.  Neurological:     Mental Status: He is alert and oriented to person, place, and time.  Psychiatric:        Mood and Affect: Mood normal.      UC Treatments / Results  Labs (all labs ordered are listed, but only abnormal results are displayed) Labs Reviewed - No data to display  EKG   Radiology No results found.  Procedures Procedures (including critical care time)  Medications Ordered in UC Medications  triamcinolone acetonide (KENALOG-40) injection 40 mg (40 mg Intramuscular Given 06/22/24 1334)    Initial Impression / Assessment and Plan / UC Course  I have reviewed the triage vital signs and the nursing notes.  Pertinent labs & imaging results that were available during my care of the patient were reviewed by me and considered in my medical decision making (see chart for details).  Plan of Care: Viral upper respiratory infection with cough, right ear pain, sinus pressure and a left ear infection: Kenalog 40 mg injection now (this is a steroid injection).  Get plenty of fluids and rest.  Cefdinir, 300 mg, 1 pill twice daily for 10 days for the ear infection.  Strongly encouraged sinus rinse kit and performing sinus rinses  twice daily.  This helps open up the sinuses and leaves behind a salt water residue that helps fight infection.  Get plenty of fluids and rest.  Follow-up if symptoms  do not improve, worsen or new symptoms occur.  If symptoms persist should see primary care or ENT for further workup of symptoms.  I reviewed the plan of care with the patient and/or the patient's guardian.  The patient and/or guardian had time to ask questions and acknowledged that the questions were answered.  I provided instruction on symptoms or reasons to return here or to go to an ER, if symptoms/condition did not improve, worsened or if new symptoms occurred.  Final Clinical Impressions(s) / UC Diagnoses   Final diagnoses:  Non-recurrent acute suppurative otitis media of left ear without spontaneous rupture of tympanic membrane  Acute otalgia, right  Sinus pressure  Viral URI with cough     Discharge Instructions      Viral upper respiratory infection with cough, right ear pain, sinus pressure and a left ear infection: Kenalog 40 mg injection now (this is a steroid injection).  Get plenty of fluids and rest.  Cefdinir, 300 mg, 1 pill twice daily for 10 days for the ear infection.  Strongly encouraged sinus rinse kit and performing sinus rinses twice daily.  This helps open up the sinuses and leaves behind a salt water residue that helps fight infection.  Get plenty of fluids and rest.  Follow-up if symptoms do not improve, worsen or new symptoms occur.  If symptoms persist should see primary care or ENT for further workup of symptoms.     ED Prescriptions     Medication Sig Dispense Auth. Provider   cefdinir (OMNICEF) 300 MG capsule Take 1 capsule (300 mg total) by mouth 2 (two) times daily for 10 days. 20 capsule Ival Domino, FNP      PDMP not reviewed this encounter.   Ival Domino, FNP 06/22/24 1347

## 2024-06-22 NOTE — ED Triage Notes (Signed)
 Pt c/o sore throat, right ear pain started this morning. He has been having nasal congestion and coughing. He recently had Augmentin on 09/02.

## 2024-06-22 NOTE — Discharge Instructions (Addendum)
 Viral upper respiratory infection with cough, right ear pain, sinus pressure and a left ear infection: Kenalog 40 mg injection now (this is a steroid injection).  Get plenty of fluids and rest.  Cefdinir, 300 mg, 1 pill twice daily for 10 days for the ear infection.  Strongly encouraged sinus rinse kit and performing sinus rinses twice daily.  This helps open up the sinuses and leaves behind a salt water residue that helps fight infection.  Get plenty of fluids and rest.  Follow-up if symptoms do not improve, worsen or new symptoms occur.  If symptoms persist should see primary care or ENT for further workup of symptoms.
